# Patient Record
Sex: Female | Born: 1970
Health system: Southern US, Community
[De-identification: ages and names within clinical notes are randomized; demographics above are authoritative.]

## PROBLEM LIST (undated history)

## (undated) DIAGNOSIS — G4730 Sleep apnea, unspecified: Secondary | ICD-10-CM

## (undated) DIAGNOSIS — I059 Rheumatic mitral valve disease, unspecified: Secondary | ICD-10-CM

## (undated) DIAGNOSIS — T7840XA Allergy, unspecified, initial encounter: Secondary | ICD-10-CM

## (undated) DIAGNOSIS — R32 Unspecified urinary incontinence: Secondary | ICD-10-CM

## (undated) DIAGNOSIS — K219 Gastro-esophageal reflux disease without esophagitis: Secondary | ICD-10-CM

## (undated) DIAGNOSIS — R011 Cardiac murmur, unspecified: Secondary | ICD-10-CM

## (undated) DIAGNOSIS — N649 Disorder of breast, unspecified: Secondary | ICD-10-CM

## (undated) DIAGNOSIS — Z8669 Personal history of other diseases of the nervous system and sense organs: Secondary | ICD-10-CM

## (undated) DIAGNOSIS — F329 Major depressive disorder, single episode, unspecified: Secondary | ICD-10-CM

## (undated) DIAGNOSIS — F419 Anxiety disorder, unspecified: Secondary | ICD-10-CM

## (undated) HISTORY — DX: Personal history of other diseases of the nervous system and sense organs: Z86.69

## (undated) HISTORY — DX: Cardiac murmur, unspecified: R01.1

## (undated) HISTORY — DX: Anxiety disorder, unspecified: F41.9

## (undated) HISTORY — DX: Disorder of breast, unspecified: N64.9

## (undated) HISTORY — DX: Rheumatic mitral valve disease, unspecified: I05.9

## (undated) HISTORY — DX: Gastro-esophageal reflux disease without esophagitis: K21.9

## (undated) HISTORY — DX: Sleep apnea, unspecified: G47.30

## (undated) HISTORY — PX: TUBAL LIGATION: SHX77

## (undated) HISTORY — DX: Unspecified urinary incontinence: R32

## (undated) HISTORY — DX: Allergy, unspecified, initial encounter: T78.40XA

## (undated) HISTORY — DX: Major depressive disorder, single episode, unspecified: F32.9

---

## 2004-08-12 ENCOUNTER — Ambulatory Visit: Payer: Self-pay | Admitting: Otolaryngology

## 2005-04-29 ENCOUNTER — Ambulatory Visit: Payer: Self-pay | Admitting: Surgery

## 2008-05-19 ENCOUNTER — Emergency Department: Payer: Self-pay | Admitting: Emergency Medicine

## 2013-10-01 ENCOUNTER — Emergency Department: Payer: Self-pay | Admitting: Emergency Medicine

## 2015-05-30 ENCOUNTER — Other Ambulatory Visit: Payer: Self-pay | Admitting: Cardiology

## 2015-05-30 DIAGNOSIS — R1011 Right upper quadrant pain: Secondary | ICD-10-CM

## 2015-05-31 ENCOUNTER — Telehealth: Payer: Self-pay | Admitting: Surgery

## 2015-05-31 ENCOUNTER — Ambulatory Visit
Admission: RE | Admit: 2015-05-31 | Discharge: 2015-05-31 | Disposition: A | Discharge status: Home / Self Care | Payer: 59 | Source: Ambulatory Visit | Attending: Cardiology | Admitting: Cardiology

## 2015-05-31 DIAGNOSIS — R1011 Right upper quadrant pain: Secondary | ICD-10-CM | POA: Diagnosis not present

## 2015-05-31 NOTE — Telephone Encounter (Signed)
Left message for pt to call and schedule for Cholelithiasis w/o obstruction

## 2015-06-12 ENCOUNTER — Ambulatory Visit: Payer: Self-pay | Admitting: Surgery

## 2015-06-13 ENCOUNTER — Ambulatory Visit: Payer: Self-pay | Admitting: Surgery

## 2015-07-23 ENCOUNTER — Ambulatory Visit: Payer: 59 | Admitting: Gastroenterology

## 2015-08-27 ENCOUNTER — Other Ambulatory Visit: Payer: Self-pay

## 2015-08-27 ENCOUNTER — Ambulatory Visit: Payer: 59 | Admitting: Gastroenterology

## 2015-08-27 DIAGNOSIS — F329 Major depressive disorder, single episode, unspecified: Secondary | ICD-10-CM | POA: Insufficient documentation

## 2015-08-27 DIAGNOSIS — R011 Cardiac murmur, unspecified: Secondary | ICD-10-CM

## 2015-08-27 DIAGNOSIS — G4730 Sleep apnea, unspecified: Secondary | ICD-10-CM | POA: Insufficient documentation

## 2015-08-27 DIAGNOSIS — F32A Depression, unspecified: Secondary | ICD-10-CM

## 2015-08-27 DIAGNOSIS — I341 Nonrheumatic mitral (valve) prolapse: Secondary | ICD-10-CM | POA: Insufficient documentation

## 2015-08-27 DIAGNOSIS — F419 Anxiety disorder, unspecified: Secondary | ICD-10-CM | POA: Insufficient documentation

## 2015-08-27 DIAGNOSIS — I059 Rheumatic mitral valve disease, unspecified: Secondary | ICD-10-CM | POA: Insufficient documentation

## 2015-08-27 HISTORY — DX: Nonrheumatic mitral (valve) prolapse: I34.1

## 2015-08-27 HISTORY — DX: Cardiac murmur, unspecified: R01.1

## 2015-08-27 HISTORY — DX: Sleep apnea, unspecified: G47.30

## 2015-08-27 HISTORY — DX: Depression, unspecified: F32.A

## 2016-07-01 ENCOUNTER — Other Ambulatory Visit: Payer: Self-pay | Admitting: Internal Medicine

## 2016-07-01 DIAGNOSIS — Z1231 Encounter for screening mammogram for malignant neoplasm of breast: Secondary | ICD-10-CM

## 2016-07-21 ENCOUNTER — Ambulatory Visit
Admission: RE | Admit: 2016-07-21 | Discharge: 2016-07-21 | Disposition: A | Discharge status: Home / Self Care | Payer: 59 | Source: Ambulatory Visit | Attending: Internal Medicine | Admitting: Internal Medicine

## 2016-07-21 DIAGNOSIS — Z1231 Encounter for screening mammogram for malignant neoplasm of breast: Secondary | ICD-10-CM | POA: Insufficient documentation

## 2017-02-22 DIAGNOSIS — I341 Nonrheumatic mitral (valve) prolapse: Secondary | ICD-10-CM | POA: Diagnosis not present

## 2017-02-22 DIAGNOSIS — J45909 Unspecified asthma, uncomplicated: Secondary | ICD-10-CM | POA: Diagnosis not present

## 2017-06-29 ENCOUNTER — Other Ambulatory Visit: Payer: Self-pay | Admitting: Internal Medicine

## 2017-06-29 DIAGNOSIS — Z1231 Encounter for screening mammogram for malignant neoplasm of breast: Secondary | ICD-10-CM

## 2017-07-29 ENCOUNTER — Ambulatory Visit
Admission: RE | Admit: 2017-07-29 | Discharge: 2017-07-29 | Disposition: A | Discharge status: Home / Self Care | Payer: 59 | Source: Ambulatory Visit | Attending: Internal Medicine | Admitting: Internal Medicine

## 2017-07-29 DIAGNOSIS — Z1231 Encounter for screening mammogram for malignant neoplasm of breast: Secondary | ICD-10-CM | POA: Diagnosis present

## 2017-12-28 DIAGNOSIS — J329 Chronic sinusitis, unspecified: Secondary | ICD-10-CM | POA: Diagnosis not present

## 2017-12-28 DIAGNOSIS — R109 Unspecified abdominal pain: Secondary | ICD-10-CM | POA: Diagnosis not present

## 2017-12-28 DIAGNOSIS — E65 Localized adiposity: Secondary | ICD-10-CM | POA: Diagnosis not present

## 2017-12-31 ENCOUNTER — Other Ambulatory Visit: Payer: Self-pay | Admitting: Internal Medicine

## 2017-12-31 DIAGNOSIS — R109 Unspecified abdominal pain: Secondary | ICD-10-CM

## 2018-01-05 ENCOUNTER — Ambulatory Visit: Payer: 59

## 2018-03-23 DIAGNOSIS — M47812 Spondylosis without myelopathy or radiculopathy, cervical region: Secondary | ICD-10-CM | POA: Diagnosis not present

## 2018-03-23 DIAGNOSIS — M542 Cervicalgia: Secondary | ICD-10-CM | POA: Diagnosis not present

## 2018-07-26 ENCOUNTER — Other Ambulatory Visit: Payer: Self-pay | Admitting: Internal Medicine

## 2018-07-26 DIAGNOSIS — Z1231 Encounter for screening mammogram for malignant neoplasm of breast: Secondary | ICD-10-CM

## 2018-08-11 ENCOUNTER — Ambulatory Visit
Admission: RE | Admit: 2018-08-11 | Discharge: 2018-08-11 | Disposition: A | Discharge status: Home / Self Care | Payer: 59 | Source: Ambulatory Visit | Attending: Internal Medicine | Admitting: Internal Medicine

## 2018-08-11 DIAGNOSIS — Z1231 Encounter for screening mammogram for malignant neoplasm of breast: Secondary | ICD-10-CM | POA: Diagnosis not present

## 2018-11-24 ENCOUNTER — Ambulatory Visit (INDEPENDENT_AMBULATORY_CARE_PROVIDER_SITE_OTHER): Payer: 59 | Admitting: Internal Medicine

## 2018-11-24 ENCOUNTER — Ambulatory Visit (INDEPENDENT_AMBULATORY_CARE_PROVIDER_SITE_OTHER): Payer: 59

## 2018-11-24 VITALS — BP 126/76 | HR 80 | Temp 98.4°F | Ht 64.0 in | Wt 197.0 lb

## 2018-11-24 DIAGNOSIS — E559 Vitamin D deficiency, unspecified: Secondary | ICD-10-CM

## 2018-11-24 DIAGNOSIS — R195 Other fecal abnormalities: Secondary | ICD-10-CM | POA: Diagnosis not present

## 2018-11-24 DIAGNOSIS — M79604 Pain in right leg: Secondary | ICD-10-CM | POA: Diagnosis not present

## 2018-11-24 DIAGNOSIS — F32A Depression, unspecified: Secondary | ICD-10-CM

## 2018-11-24 DIAGNOSIS — F419 Anxiety disorder, unspecified: Secondary | ICD-10-CM | POA: Diagnosis not present

## 2018-11-24 DIAGNOSIS — Z1329 Encounter for screening for other suspected endocrine disorder: Secondary | ICD-10-CM

## 2018-11-24 DIAGNOSIS — M25561 Pain in right knee: Secondary | ICD-10-CM | POA: Diagnosis not present

## 2018-11-24 DIAGNOSIS — Z0000 Encounter for general adult medical examination without abnormal findings: Secondary | ICD-10-CM

## 2018-11-24 DIAGNOSIS — Z0184 Encounter for antibody response examination: Secondary | ICD-10-CM

## 2018-11-24 DIAGNOSIS — Z1389 Encounter for screening for other disorder: Secondary | ICD-10-CM

## 2018-11-24 DIAGNOSIS — F329 Major depressive disorder, single episode, unspecified: Secondary | ICD-10-CM

## 2018-11-24 DIAGNOSIS — I341 Nonrheumatic mitral (valve) prolapse: Secondary | ICD-10-CM

## 2018-11-24 DIAGNOSIS — Z13220 Encounter for screening for lipoid disorders: Secondary | ICD-10-CM

## 2018-11-24 DIAGNOSIS — M1711 Unilateral primary osteoarthritis, right knee: Secondary | ICD-10-CM | POA: Diagnosis not present

## 2018-11-24 DIAGNOSIS — Z1159 Encounter for screening for other viral diseases: Secondary | ICD-10-CM

## 2018-11-24 MED ORDER — LORAZEPAM 1 MG PO TABS: 1.0000 mg | ORAL_TABLET | Freq: Two times a day (BID) | ORAL | 0 refills | Status: DC | PRN

## 2018-11-24 NOTE — Patient Instructions (Signed)

## 2018-11-24 NOTE — Progress Notes (Signed)
Pre visit review using our clinic review tool, if applicable. No additional management support is needed unless otherwise documented below in the visit note. 

## 2018-11-25 ENCOUNTER — Encounter: Payer: Self-pay | Admitting: Internal Medicine

## 2018-11-25 DIAGNOSIS — M25561 Pain in right knee: Secondary | ICD-10-CM | POA: Insufficient documentation

## 2018-11-25 DIAGNOSIS — R195 Other fecal abnormalities: Secondary | ICD-10-CM | POA: Insufficient documentation

## 2018-11-25 NOTE — Progress Notes (Addendum)
Chief Complaint  Patient presents with  . Follow-up   New patient  1. H/o anxiety depression stable on current medications pristique, wellbutrin, ativan prn  2. Loose stools s/p eating has to go immediately daughter dx'ed with crohns recently  3. Right knee and right lower leg pain s/p trauma 9-10 years ago waking up qhs with pain takes otc pain medications with pain with some relief  4. H/o MVP get echo from Dr. Lavera Guise former PCP   Review of Systems  Constitutional: Negative for weight loss.  HENT: Negative for hearing loss.   Eyes: Negative for blurred vision.  Respiratory: Negative for shortness of breath.   Cardiovascular: Negative for chest pain.  Gastrointestinal: Negative for abdominal pain.       Loose stools after eating    Musculoskeletal: Positive for joint pain.  Skin: Negative for rash.  Neurological: Negative for dizziness.  Psychiatric/Behavioral: Negative for depression. The patient is not nervous/anxious.        Stress at work     Past Medical History:  Diagnosis Date  . Allergy   . Anxiety and depression 08/27/2015  . Ejection murmur 08/27/2015  . GERD (gastroesophageal reflux disease)   . Hx of migraines   . Prolapsing mitral leaflet syndrome 08/27/2015  . Sleep apnea 08/27/2015  . Urine incontinence    History reviewed. No pertinent surgical history. Family History  Problem Relation Age of Onset  . Depression Mother   . Mental illness Mother   . Depression Maternal Grandmother   . Arthritis Maternal Grandmother   . Heart disease Maternal Grandmother   . Hyperlipidemia Maternal Grandmother   . Mental illness Maternal Grandmother   . Depression Paternal Grandmother   . Stroke Paternal Grandmother   . Heart disease Paternal Grandmother   . Alcohol abuse Father   . Cancer Father        lung smoker  . Early death Father   . Heart disease Father   . Depression Daughter   . Crohn's disease Daughter   . Cancer Paternal Aunt        lung   . Cancer  Paternal Uncle        ? type  . Birth defects Maternal Grandfather   . COPD Maternal Grandfather   . Breast cancer Neg Hx    Social History   Socioeconomic History  . Marital status: Married    Spouse name: Not on file  . Number of children: Not on file  . Years of education: Not on file  . Highest education level: Not on file  Occupational History  . Not on file  Social Needs  . Financial resource strain: Not on file  . Food insecurity:    Worry: Not on file    Inability: Not on file  . Transportation needs:    Medical: Not on file    Non-medical: Not on file  Tobacco Use  . Smoking status: Never Smoker  . Smokeless tobacco: Never Used  . Tobacco comment: [place] light smoker   Substance and Sexual Activity  . Alcohol use: No    Alcohol/week: 0.0 standard drinks  . Drug use: No  . Sexual activity: Yes  Lifestyle  . Physical activity:    Days per week: Not on file    Minutes per session: Not on file  . Stress: Not on file  Relationships  . Social connections:    Talks on phone: Not on file    Gets together: Not on file  Attends religious service: Not on file    Active member of club or organization: Not on file    Attends meetings of clubs or organizations: Not on file    Relationship status: Not on file  . Intimate partner violence:    Fear of current or ex partner: Not on file    Emotionally abused: Not on file    Physically abused: Not on file    Forced sexual activity: Not on file  Other Topics Concern  . Not on file  Social History Narrative   Married    2 kids    Works [employer] x 20 years [employer]   Owns guns, wears seat belt, safe in relationship    Current Meds  Medication Sig  . baclofen (LIORESAL) 10 MG tablet Take 10 mg by mouth 3 (three) times daily as needed for muscle spasms.  Marland Kitchen buPROPion (WELLBUTRIN XL) 150 MG 24 hr tablet Take 450 mg by mouth daily.  . Cholecalciferol (VITAMIN D3) 1.25 MG (50000 UT) TABS Take by mouth.  .  desvenlafaxine (PRISTIQ) 100 MG 24 hr tablet Take 100 mg by mouth daily.  Marland Kitchen LORazepam (ATIVAN) 1 MG tablet Take 1 tablet (1 mg total) by mouth 2 (two) times daily as needed for anxiety.  . meloxicam (MOBIC) 7.5 MG tablet Take by mouth daily.   . metoprolol succinate (TOPROL-XL) 50 MG 24 hr tablet Take 50 mg by mouth daily. Take with or immediately following a meal.  . Multiple Vitamins-Minerals (VITAMIN D3 COMPLETE PO) Take 5,000 Units by mouth daily.  Marland Kitchen omeprazole (PRILOSEC) 20 MG capsule Take 20 mg by mouth daily.   Marland Kitchen selenium 200 MCG TABS tablet Take by mouth daily.  . [DISCONTINUED] buPROPion (ZYBAN) 150 MG 12 hr tablet Take 150 mg by mouth 2 (two) times daily.  . [DISCONTINUED] LORazepam (ATIVAN) 1 MG tablet Take 1 mg by mouth every 8 (eight) hours.   No Known Allergies No results found for this or any previous visit (from the past 2160 hour(s)). Objective  Body mass index is 33.81 kg/m. Wt Readings from Last 3 Encounters:  11/24/18 197 lb (89.4 kg)   Temp Readings from Last 3 Encounters:  11/24/18 98.4 F (36.9 C) (Oral)   BP Readings from Last 3 Encounters:  11/24/18 126/76   Pulse Readings from Last 3 Encounters:  11/24/18 80    Physical Exam Vitals signs and nursing note reviewed.  Constitutional:      Appearance: Normal appearance. She is well-developed and well-groomed.  HENT:     Head: Normocephalic and atraumatic.     Nose: Nose normal.     Mouth/Throat:     Mouth: Mucous membranes are moist.     Pharynx: Oropharynx is clear.  Eyes:     Conjunctiva/sclera: Conjunctivae normal.     Pupils: Pupils are equal, round, and reactive to light.  Cardiovascular:     Rate and Rhythm: Normal rate and regular rhythm.     Heart sounds: Murmur present.  Pulmonary:     Effort: Pulmonary effort is normal.     Breath sounds: Normal breath sounds.  Musculoskeletal:       Legs:  Skin:    General: Skin is warm and dry.  Neurological:     General: No focal deficit  present.     Mental Status: She is alert and oriented to person, place, and time. Mental status is at baseline.     Gait: Gait normal.  Psychiatric:  Attention and Perception: Attention and perception normal.        Mood and Affect: Mood and affect normal.        Speech: Speech normal.        Behavior: Behavior normal. Behavior is cooperative.        Thought Content: Thought content normal.        Cognition and Memory: Cognition and memory normal.        Judgment: Judgment normal.     Assessment   1. H/o anxiety depression stable  2. Loose stools s/p eating  3. Right knee and right lower leg pain s/p trauma 9-10 years ago waking up qhs with pain  4. H/o MVP  5. HM Plan   1. Refilled Ativan today prn  2. Referred to GI  3.  Xray right knee and lower leg  4. Get records Dr. Lavera Guise echo no active sx's  5.  Flu shot had 06/11/18  Tdap 10/2014 check [facility] Check fasting labs [facility]  Pap at f/u  mammo 08/11/18 negative  Colonoscopy age 74 yo   Former PCP Dr. Lavera Guise  Eye Dr. Matilde Sprang   [facility] 12/21/2018 URI given Augmentin bid x 10 days rec otc meds  [facility] 04/24/19 BP 135/88 for rectal pain with large blood inurine c/w UTI given macrobid   Provider: Dr. Olivia Mackie [facility]

## 2018-11-27 ENCOUNTER — Other Ambulatory Visit: Payer: Self-pay | Admitting: Internal Medicine

## 2018-11-27 DIAGNOSIS — G8929 Other chronic pain: Secondary | ICD-10-CM

## 2018-11-27 DIAGNOSIS — M79604 Pain in right leg: Secondary | ICD-10-CM

## 2018-11-27 DIAGNOSIS — M25561 Pain in right knee: Principal | ICD-10-CM

## 2018-11-28 NOTE — Progress Notes (Signed)
Tdap not in [facility]  TMS 

## 2018-11-28 NOTE — Progress Notes (Signed)
Patient not in [facility]. 

## 2018-11-29 ENCOUNTER — Other Ambulatory Visit (INDEPENDENT_AMBULATORY_CARE_PROVIDER_SITE_OTHER): Payer: 59

## 2018-11-29 DIAGNOSIS — Z1389 Encounter for screening for other disorder: Secondary | ICD-10-CM

## 2018-11-29 DIAGNOSIS — Z1159 Encounter for screening for other viral diseases: Secondary | ICD-10-CM | POA: Diagnosis not present

## 2018-11-29 DIAGNOSIS — Z0000 Encounter for general adult medical examination without abnormal findings: Secondary | ICD-10-CM | POA: Diagnosis not present

## 2018-11-29 DIAGNOSIS — Z0184 Encounter for antibody response examination: Secondary | ICD-10-CM

## 2018-11-29 DIAGNOSIS — Z1329 Encounter for screening for other suspected endocrine disorder: Secondary | ICD-10-CM

## 2018-11-29 DIAGNOSIS — E559 Vitamin D deficiency, unspecified: Secondary | ICD-10-CM

## 2018-11-29 DIAGNOSIS — Z13220 Encounter for screening for lipoid disorders: Secondary | ICD-10-CM

## 2018-11-29 NOTE — Addendum Note (Signed)
Addended by: Arby Barrette on: 11/29/2018 08:51 AM   Modules accepted: Orders

## 2018-11-30 ENCOUNTER — Other Ambulatory Visit: Payer: Self-pay | Admitting: Internal Medicine

## 2018-11-30 DIAGNOSIS — R739 Hyperglycemia, unspecified: Secondary | ICD-10-CM

## 2018-11-30 LAB — URINALYSIS, ROUTINE W REFLEX MICROSCOPIC
Bilirubin, UA: NEGATIVE
Glucose, UA: NEGATIVE
KETONES UA: NEGATIVE
Leukocytes, UA: NEGATIVE
NITRITE UA: NEGATIVE
PROTEIN UA: NEGATIVE
RBC, UA: NEGATIVE
Specific Gravity, UA: 1.022 (ref 1.005–1.030)
UUROB: 0.2 mg/dL (ref 0.2–1.0)
pH, UA: 5.5 (ref 5.0–7.5)

## 2018-12-02 ENCOUNTER — Encounter: Payer: Self-pay | Admitting: *Deleted

## 2018-12-02 ENCOUNTER — Telehealth: Payer: Self-pay | Admitting: Internal Medicine

## 2018-12-02 LAB — COMPREHENSIVE METABOLIC PANEL
AG Ratio: 1.8 (calc) (ref 1.0–2.5)
ALT: 25 U/L (ref 6–29)
AST: 16 U/L (ref 10–35)
Albumin: 4.1 g/dL (ref 3.6–5.1)
Alkaline phosphatase (APISO): 87 U/L (ref 31–125)
BILIRUBIN TOTAL: 0.5 mg/dL (ref 0.2–1.2)
BUN: 14 mg/dL (ref 7–25)
CHLORIDE: 105 mmol/L (ref 98–110)
CO2: 23 mmol/L (ref 20–32)
Calcium: 9 mg/dL (ref 8.6–10.2)
Creat: 1.1 mg/dL (ref 0.50–1.10)
Globulin: 2.3 g/dL (calc) (ref 1.9–3.7)
Glucose, Bld: 107 mg/dL — ABNORMAL HIGH (ref 65–99)
POTASSIUM: 4.2 mmol/L (ref 3.5–5.3)
Sodium: 140 mmol/L (ref 135–146)
TOTAL PROTEIN: 6.4 g/dL (ref 6.1–8.1)

## 2018-12-02 LAB — TEST AUTHORIZATION

## 2018-12-02 LAB — CBC WITH DIFFERENTIAL/PLATELET
Absolute Monocytes: 540 cells/uL (ref 200–950)
BASOS PCT: 1.1 %
Basophils Absolute: 78 cells/uL (ref 0–200)
EOS ABS: 227 {cells}/uL (ref 15–500)
Eosinophils Relative: 3.2 %
HEMATOCRIT: 37.9 % (ref 35.0–45.0)
HEMOGLOBIN: 13.3 g/dL (ref 11.7–15.5)
LYMPHS ABS: 1385 {cells}/uL (ref 850–3900)
MCH: 32.9 pg (ref 27.0–33.0)
MCHC: 35.1 g/dL (ref 32.0–36.0)
MCV: 93.8 fL (ref 80.0–100.0)
MPV: 12.4 fL (ref 7.5–12.5)
Monocytes Relative: 7.6 %
Neutro Abs: 4871 cells/uL (ref 1500–7800)
Neutrophils Relative %: 68.6 %
Platelets: 293 10*3/uL (ref 140–400)
RBC: 4.04 10*6/uL (ref 3.80–5.10)
RDW: 11.8 % (ref 11.0–15.0)
TOTAL LYMPHOCYTE: 19.5 %
WBC: 7.1 10*3/uL (ref 3.8–10.8)

## 2018-12-02 LAB — MEASLES/MUMPS/RUBELLA IMMUNITY: Rubella: 10.1 index

## 2018-12-02 LAB — LIPID PANEL
CHOL/HDL RATIO: 6 (calc) — AB (ref <5.0)
CHOLESTEROL: 228 mg/dL — AB (ref <200)
HDL: 38 mg/dL — ABNORMAL LOW (ref >=50)
LDL Cholesterol (Calc): 165 mg/dL (calc) — ABNORMAL HIGH
Non-HDL Cholesterol (Calc): 190 mg/dL (calc) — ABNORMAL HIGH (ref <130)
TRIGLYCERIDES: 120 mg/dL (ref <150)

## 2018-12-02 LAB — T4, FREE: Free T4: 0.9 ng/dL (ref 0.8–1.8)

## 2018-12-02 LAB — TSH: TSH: 3.56 mIU/L

## 2018-12-02 LAB — HEMOGLOBIN A1C
EAG (MMOL/L): 5 (calc)
Hgb A1c MFr Bld: 4.8 % of total Hgb (ref <5.7)
Mean Plasma Glucose: 91 (calc)

## 2018-12-02 LAB — HEPATITIS B SURFACE ANTIBODY, QUANTITATIVE: Hepatitis B-Post: <5 m[IU]/mL — ABNORMAL LOW (ref >=10)

## 2018-12-02 LAB — VITAMIN D 25 HYDROXY (VIT D DEFICIENCY, FRACTURES): Vit D, 25-Hydroxy: 32 ng/mL (ref 30–100)

## 2018-12-02 NOTE — Telephone Encounter (Signed)
Pt given results per notes of Dr. Terese Door on 12/02/18, patient verbalized understanding. She says she is not taking a weekly dose of Vitamin D, only the daily OTC dose of 5000 IU. She says she has an appointment in April and will start the hep B vaccines at that time. Unable to document in result note due to result note not being routed to [facility]

## 2018-12-12 DIAGNOSIS — R195 Other fecal abnormalities: Secondary | ICD-10-CM | POA: Diagnosis not present

## 2018-12-12 DIAGNOSIS — R194 Change in bowel habit: Secondary | ICD-10-CM | POA: Diagnosis not present

## 2018-12-19 DIAGNOSIS — R194 Change in bowel habit: Secondary | ICD-10-CM | POA: Diagnosis not present

## 2018-12-19 DIAGNOSIS — R195 Other fecal abnormalities: Secondary | ICD-10-CM | POA: Diagnosis not present

## 2019-01-26 ENCOUNTER — Other Ambulatory Visit: Payer: Self-pay | Admitting: Internal Medicine

## 2019-01-26 DIAGNOSIS — F419 Anxiety disorder, unspecified: Secondary | ICD-10-CM

## 2019-01-26 MED ORDER — LORAZEPAM 1 MG PO TABS: 1.0000 mg | ORAL_TABLET | Freq: Two times a day (BID) | ORAL | 2 refills | Status: DC | PRN

## 2019-02-01 ENCOUNTER — Ambulatory Visit: Payer: 59 | Admitting: Internal Medicine

## 2019-04-24 DIAGNOSIS — N3001 Acute cystitis with hematuria: Secondary | ICD-10-CM | POA: Diagnosis not present

## 2019-05-02 ENCOUNTER — Ambulatory Visit (INDEPENDENT_AMBULATORY_CARE_PROVIDER_SITE_OTHER): Payer: 59 | Admitting: Internal Medicine

## 2019-05-02 ENCOUNTER — Other Ambulatory Visit: Payer: Self-pay

## 2019-05-02 DIAGNOSIS — Z0000 Encounter for general adult medical examination without abnormal findings: Secondary | ICD-10-CM | POA: Insufficient documentation

## 2019-05-02 DIAGNOSIS — N926 Irregular menstruation, unspecified: Secondary | ICD-10-CM | POA: Diagnosis not present

## 2019-05-02 DIAGNOSIS — Z1231 Encounter for screening mammogram for malignant neoplasm of breast: Secondary | ICD-10-CM

## 2019-05-02 DIAGNOSIS — R030 Elevated blood-pressure reading, without diagnosis of hypertension: Secondary | ICD-10-CM | POA: Diagnosis not present

## 2019-05-02 DIAGNOSIS — N3001 Acute cystitis with hematuria: Secondary | ICD-10-CM | POA: Diagnosis not present

## 2019-05-02 DIAGNOSIS — R69 Illness, unspecified: Secondary | ICD-10-CM | POA: Diagnosis not present

## 2019-05-02 DIAGNOSIS — N3000 Acute cystitis without hematuria: Secondary | ICD-10-CM | POA: Diagnosis not present

## 2019-05-02 DIAGNOSIS — Z124 Encounter for screening for malignant neoplasm of cervix: Secondary | ICD-10-CM | POA: Diagnosis not present

## 2019-05-02 DIAGNOSIS — F419 Anxiety disorder, unspecified: Secondary | ICD-10-CM

## 2019-05-02 DIAGNOSIS — E785 Hyperlipidemia, unspecified: Secondary | ICD-10-CM | POA: Diagnosis not present

## 2019-05-02 MED ORDER — LORAZEPAM 1 MG PO TABS: 1.0000 mg | ORAL_TABLET | Freq: Two times a day (BID) | ORAL | 2 refills | Status: DC | PRN

## 2019-05-02 NOTE — Progress Notes (Signed)
Telephone Note  I connected with Carol Stevens   on 05/02/19 at  2:00 PM EDT by  telephone and verified that I am speaking with the correct person using two identifiers.  Location patient: home Location provider:work Persons participating in the virtual visit: patient, provider  I discussed the limitations of evaluation and management by telemedicine and the availability of in person appointments. The patient expressed understanding and agreed to proceed.   HPI: 1. Annual  2. Abnormal menses with cycles every 2 weeks since 02/2019. And she is due for pap will refer to OB/GYN Dr. Darron Do[facility]  3. Urgent care/[facility] clinic appts 12/2018 URI and 04/24/2019 UTI and rectal pain and blood in urine and unable to pee given macrobid and doing better able to urinate BP was elevated this visit 135/88  4. Depression and anxiety worse with COVID stressor at work takes prn Ativan and wellbutrin 450 mg qd and pristique   ROS: See pertinent positives and negatives per HPI. General: weight stable  HEENT: no sore throat  CV: no chest pain  Lungs: no sob  GI: ab bloating and pain pending colonoscopy 05/30/19  GU: abnormal menses Skin: no issues  Mood: variable and anxiety increased + Neuro: no h/a  MSK: no pain   HEENT Past Medical History:  Diagnosis Date  . Allergy   . Anxiety and depression 08/27/2015  . Ejection murmur 08/27/2015  . GERD (gastroesophageal reflux disease)   . Hx of migraines   . Prolapsing mitral leaflet syndrome 08/27/2015  . Sleep apnea 08/27/2015  . Urine incontinence     No past surgical history on file.  Family History  Problem Relation Age of Onset  . Depression Mother   . Mental illness Mother   . Depression Maternal Grandmother   . Arthritis Maternal Grandmother   . Heart disease Maternal Grandmother   . Hyperlipidemia Maternal Grandmother   . Mental illness Maternal Grandmother   . Depression Paternal Grandmother   . Stroke Paternal  Grandmother   . Heart disease Paternal Grandmother   . Alcohol abuse Father   . Cancer Father        lung smoker  . Early death Father   . Heart disease Father   . Depression Daughter   . Crohn's disease Daughter   . Cancer Paternal Aunt        lung   . Cancer Paternal Uncle        ? type  . Birth defects Maternal Grandfather   . COPD Maternal Grandfather   . Breast cancer Neg Hx     SOCIAL HX: married with kids    Current Outpatient Medications:  .  baclofen (LIORESAL) 10 MG tablet, Take 10 mg by mouth 3 (three) times daily as needed for muscle spasms., Disp: , Rfl:  .  buPROPion (WELLBUTRIN XL) 150 MG 24 hr tablet, Take 450 mg by mouth daily., Disp: , Rfl:  .  Cholecalciferol (VITAMIN D3) 1.25 MG (50000 UT) TABS, Take by mouth., Disp: , Rfl:  .  desvenlafaxine (PRISTIQ) 100 MG 24 hr tablet, Take 100 mg by mouth daily., Disp: , Rfl:  .  LORazepam (ATIVAN) 1 MG tablet, Take 1 tablet (1 mg total) by mouth 2 (two) times daily as needed for anxiety., Disp: 60 tablet, Rfl: 2 .  meloxicam (MOBIC) 7.5 MG tablet, Take by mouth daily. , Disp: , Rfl:  .  metoprolol succinate (TOPROL-XL) 50 MG 24 hr tablet, Take 50 mg by mouth daily.  Take with or immediately following a meal., Disp: , Rfl:  .  Multiple Vitamins-Minerals (VITAMIN D3 COMPLETE PO), Take 5,000 Units by mouth daily., Disp: , Rfl:  .  omeprazole (PRILOSEC) 20 MG capsule, Take 20 mg by mouth daily. , Disp: , Rfl:  .  selenium 200 MCG TABS tablet, Take by mouth daily., Disp: , Rfl:   EXAM:  VITALS per patient if applicable:  GENERAL: alert, oriented, appears well and in no acute distress  PSYCH/NEURO: pleasant and cooperative, no obvious depression or anxiety, speech and thought processing grossly intact  ASSESSMENT AND PLAN:  Discussed the following assessment and plan:  Annual physical exam - Plan:  flu shot had 06/11/18  Tdap 10/2014 check [facility] not in [place] per pt had in 2016 due in 2026  Check lipid fasting fsh and ua  and culture  Consider MMR vaccine and hep B  Pap at referred ob/gyn Dr. Darron Doom for abnormal cycles and check [facility] and TVUS/pelvic US  mammo 08/11/18 negative referred upcoming 08/12/19 Colonoscopy age 71 yo appt [facility] 05/30/2019 due to abdominal issues  skin-no issues   Former PCP Dr. Lavera Guise  Eye Dr. Matilde Sprang   UTI improved Repeat UA and culture    Elevated BP  -monitor pt to purchase BP cuff to monitor  On toprol xl 50 mg daily  Consider add another agent in future if continues to elevated I.e hctz, norvasc 2.5  [facility] 12/21/2018 URI given Augmentin bid x 10 days rec otc meds  [facility] 04/24/19 BP 135/88 for rectal pain with large blood inurine c/w UTI given macrobid  -pt was unable to pee but able to pee now as of 05/02/19    Hyperlipidemia, unspecified hyperlipidemia type - Plan: Lipid panel  Anxiety - Plan: LORazepam (ATIVAN) 1 MG tablet bid prn   I discussed the assessment and treatment plan with the patient. The patient was provided an opportunity to ask questions and all were answered. The patient agreed with the plan and demonstrated an understanding of the instructions.   The patient was advised to call back or seek an in-person evaluation if the symptoms worsen or if the condition fails to improve as anticipated.  Time spent 20 minutes  Delorise Jackson, MD

## 2019-05-02 NOTE — Patient Instructions (Addendum)
Low-FODMAP Eating Plan  FODMAPs (fermentable oligosaccharides, disaccharides, monosaccharides, and polyols) are sugars that are hard for some people to digest. A low-FODMAP eating plan may help some people who have bowel (intestinal) diseases to manage their symptoms. This meal plan can be complicated to follow. Work with a diet and nutrition specialist (dietitian) to make a low-FODMAP eating plan that is right for you. A dietitian can make sure that you get enough nutrition from this diet. What are tips for following this plan? Reading food labels  Check labels for hidden FODMAPs such as: ? High-fructose syrup. ? Honey. ? Agave. ? Natural fruit flavors. ? Onion or garlic powder.  Choose low-FODMAP foods that contain 3-4 grams of fiber per serving.  Check food labels for serving sizes. Eat only one serving at a time to make sure FODMAP levels stay low. Meal planning  Follow a low-FODMAP eating plan for up to 6 weeks, or as told by your health care provider or dietitian.  To follow the eating plan: 1. Eliminate high-FODMAP foods from your diet completely. 2. Gradually reintroduce high-FODMAP foods into your diet one at a time. Most people should wait a few days after introducing one high-FODMAP food before they introduce the next high-FODMAP food. Your dietitian can recommend how quickly you may reintroduce foods. 3. Keep a daily record of what you eat and drink, and make note of any symptoms that you have after eating. 4. Review your daily record with a dietitian regularly. Your dietitian can help you identify which foods you can eat and which foods you should avoid. General tips  Drink enough fluid each day to keep your urine pale yellow.  Avoid processed foods. These often have added sugar and may be high in FODMAPs.  Avoid most dairy products, whole grains, and sweeteners.  Work with a dietitian to make sure you get enough fiber in your diet. Recommended foods Grains   Gluten-free grains, such as rice, oats, buckwheat, quinoa, corn, polenta, and millet. Gluten-free pasta, bread, or cereal. Rice noodles. Corn tortillas. Vegetables  Eggplant, zucchini, cucumber, peppers, green beans, Brussels sprouts, bean sprouts, lettuce, arugula, kale, Swiss chard, spinach, collard greens, bok choy, summer squash, potato, and tomato. Limited amounts of corn, carrot, and sweet potato. Green parts of scallions. Fruits  Bananas, oranges, lemons, limes, blueberries, raspberries, strawberries, grapes, cantaloupe, honeydew melon, kiwi, papaya, passion fruit, and pineapple. Limited amounts of dried cranberries, banana chips, and shredded coconut. Dairy  Lactose-free milk, yogurt, and kefir. Lactose-free cottage cheese and ice cream. Non-dairy milks, such as almond, coconut, hemp, and rice milk. Yogurts made of non-dairy milks. Limited amounts of goat cheese, brie, mozzarella, parmesan, swiss, and other hard cheeses. Meats and other protein foods  Unseasoned beef, pork, poultry, or fish. Eggs. Bacon. Tofu (firm) and tempeh. Limited amounts of nuts and seeds, such as almonds, walnuts, brazil nuts, pecans, peanuts, pumpkin seeds, chia seeds, and sunflower seeds. Fats and oils  Butter-free spreads. Vegetable oils, such as olive, canola, and sunflower oil. Seasoning and other foods  Artificial sweeteners with names that do not end in "ol" such as aspartame, saccharine, and stevia. Maple syrup, white table sugar, raw sugar, brown sugar, and molasses. Fresh basil, coriander, parsley, rosemary, and thyme. Beverages  Water and mineral water. Sugar-sweetened soft drinks. Small amounts of orange juice or cranberry juice. Black and green tea. Most dry wines. Coffee. This may not be a complete list of low-FODMAP foods. Talk with your dietitian for more information. Foods to avoid Grains  Wheat,   including kamut, durum, and semolina. Barley and bulgur. Couscous. Wheat-based cereals. Wheat  noodles, bread, crackers, and pastries. Vegetables  Chicory root, artichoke, asparagus, cabbage, snow peas, sugar snap peas, mushrooms, and cauliflower. Onions, garlic, leeks, and the white part of scallions. Fruits  Fresh, dried, and juiced forms of apple, pear, watermelon, peach, plum, cherries, apricots, blackberries, boysenberries, figs, nectarines, and mango. Avocado. Dairy  Milk, yogurt, ice cream, and soft cheese. Cream and sour cream. Milk-based sauces. Custard. Meats and other protein foods  Fried or fatty meat. Sausage. Cashews and pistachios. Soybeans, baked beans, black beans, chickpeas, kidney beans, fava beans, navy beans, lentils, and split peas. Seasoning and other foods  Any sugar-free gum or candy. Foods that contain artificial sweeteners such as sorbitol, mannitol, isomalt, or xylitol. Foods that contain honey, high-fructose corn syrup, or agave. Bouillon, vegetable stock, beef stock, and chicken stock. Garlic and onion powder. Condiments made with onion, such as hummus, chutney, pickles, relish, salad dressing, and salsa. Tomato paste. Beverages  Chicory-based drinks. Coffee substitutes. Chamomile tea. Fennel tea. Sweet or fortified wines such as port or sherry. Diet soft drinks made with isomalt, mannitol, maltitol, sorbitol, or xylitol. Apple, pear, and mango juice. Juices with high-fructose corn syrup. This may not be a complete list of high-FODMAP foods. Talk with your dietitian to discuss what dietary choices are best for you.  Summary  A low-FODMAP eating plan is a short-term diet that eliminates FODMAPs from your diet to help ease symptoms of certain bowel diseases.  The eating plan usually lasts up to 6 weeks. After that, high-FODMAP foods are restarted gradually, one at a time, so you can find out which may be causing symptoms.  A low-FODMAP eating plan can be complicated. It is best to work with a dietitian who has experience with this type of plan. This  information is not intended to replace advice given to you by your health care provider. Make sure you discuss any questions you have with your health care provider. Document Released: 05/25/2017 Document Revised: 09/10/2017 Document Reviewed: 05/25/2017 Elsevier Patient Education  [place].  Low-Sodium Eating Plan Sodium, which is an element that makes up salt, helps you maintain a healthy balance of fluids in your body. Too much sodium can increase your blood pressure and cause fluid and waste to be held in your body. Your health care provider or dietitian may recommend following this plan if you have high blood pressure (hypertension), kidney disease, liver disease, or heart failure. Eating less sodium can help lower your blood pressure, reduce swelling, and protect your heart, liver, and kidneys. What are tips for following this plan? General guidelines  Most people on this plan should limit their sodium intake to 1,500-2,000 mg (milligrams) of sodium each day. Reading food labels   The Nutrition Facts label lists the amount of sodium in one serving of the food. If you eat more than one serving, you must multiply the listed amount of sodium by the number of servings.  Choose foods with less than 140 mg of sodium per serving.  Avoid foods with 300 mg of sodium or more per serving. Shopping  Look for lower-sodium products, often labeled as "low-sodium" or "no salt added."  Always check the sodium content even if foods are labeled as "unsalted" or "no salt added".  Buy fresh foods. ? Avoid canned foods and premade or frozen meals. ? Avoid canned, cured, or processed meats  Buy breads that have less than 80 mg of sodium per slice.  Cooking  Eat more home-cooked food and less restaurant, buffet, and fast food.  Avoid adding salt when cooking. Use salt-free seasonings or herbs instead of table salt or sea salt. Check with your health care provider or pharmacist before using  salt substitutes.  Cook with plant-based oils, such as canola, sunflower, or olive oil. Meal planning  When eating at a restaurant, ask that your food be prepared with less salt or no salt, if possible.  Avoid foods that contain MSG (monosodium glutamate). MSG is sometimes added to [place] food, bouillon, and some canned foods. What foods are recommended? The items listed may not be a complete list. Talk with your dietitian about what dietary choices are best for you. Grains Low-sodium cereals, including oats, puffed wheat and rice, and shredded wheat. Low-sodium crackers. Unsalted rice. Unsalted pasta. Low-sodium bread. Whole-grain breads and whole-grain pasta. Vegetables Fresh or frozen vegetables. "No salt added" canned vegetables. "No salt added" tomato sauce and paste. Low-sodium or reduced-sodium tomato and vegetable juice. Fruits Fresh, frozen, or canned fruit. Fruit juice. Meats and other protein foods Fresh or frozen (no salt added) meat, poultry, seafood, and fish. Low-sodium canned tuna and salmon. Unsalted nuts. Dried peas, beans, and lentils without added salt. Unsalted canned beans. Eggs. Unsalted nut butters. Dairy Milk. Soy milk. Cheese that is naturally low in sodium, such as ricotta cheese, fresh mozzarella, or Swiss cheese Low-sodium or reduced-sodium cheese. Cream cheese. Yogurt. Fats and oils Unsalted butter. Unsalted margarine with no trans fat. Vegetable oils such as canola or olive oils. Seasonings and other foods Fresh and dried herbs and spices. Salt-free seasonings. Low-sodium mustard and ketchup. Sodium-free salad dressing. Sodium-free light mayonnaise. Fresh or refrigerated horseradish. Lemon juice. Vinegar. Homemade, reduced-sodium, or low-sodium soups. Unsalted popcorn and pretzels. Low-salt or salt-free chips. What foods are not recommended? The items listed may not be a complete list. Talk with your dietitian about what dietary choices are best for you.  Grains Instant hot cereals. Bread stuffing, pancake, and biscuit mixes. Croutons. Seasoned rice or pasta mixes. Noodle soup cups. Boxed or frozen macaroni and cheese. Regular salted crackers. Self-rising flour. Vegetables Sauerkraut, pickled vegetables, and relishes. Olives. [place] fries. Onion rings. Regular canned vegetables (not low-sodium or reduced-sodium). Regular canned tomato sauce and paste (not low-sodium or reduced-sodium). Regular tomato and vegetable juice (not low-sodium or reduced-sodium). Frozen vegetables in sauces. Meats and other protein foods Meat or fish that is salted, canned, smoked, spiced, or pickled. Bacon, ham, sausage, hotdogs, corned beef, chipped beef, packaged lunch meats, salt pork, jerky, pickled herring, anchovies, regular canned tuna, sardines, salted nuts. Dairy Processed cheese and cheese spreads. Cheese curds. Blue cheese. Feta cheese. String cheese. Regular cottage cheese. Buttermilk. Canned milk. Fats and oils Salted butter. Regular margarine. Ghee. Bacon fat. Seasonings and other foods Onion salt, garlic salt, seasoned salt, table salt, and sea salt. Canned and packaged gravies. Worcestershire sauce. Tartar sauce. Barbecue sauce. Teriyaki sauce. Soy sauce, including reduced-sodium. Steak sauce. Fish sauce. Oyster sauce. Cocktail sauce. Horseradish that you find on the shelf. Regular ketchup and mustard. Meat flavorings and tenderizers. Bouillon cubes. Hot sauce and Tabasco sauce. Premade or packaged marinades. Premade or packaged taco seasonings. Relishes. Regular salad dressings. Salsa. Potato and tortilla chips. Corn chips and puffs. Salted popcorn and pretzels. Canned or dried soups. Pizza. Frozen entrees and pot pies. Summary  Eating less sodium can help lower your blood pressure, reduce swelling, and protect your heart, liver, and kidneys.  Most people on this plan should limit their sodium  intake to 1,500-2,000 mg (milligrams) of sodium each day.   Canned, boxed, and frozen foods are high in sodium. Restaurant foods, fast foods, and pizza are also very high in sodium. You also get sodium by adding salt to food.  Try to cook at home, eat more fresh fruits and vegetables, and eat less fast food, canned, processed, or prepared foods. This information is not intended to replace advice given to you by your health care provider. Make sure you discuss any questions you have with your health care provider. Document Released: 03/20/2002 Document Revised: 09/10/2017 Document Reviewed: 09/21/2016 Elsevier Patient Education  2020 [employer].

## 2019-05-05 ENCOUNTER — Telehealth: Payer: Self-pay | Admitting: Internal Medicine

## 2019-05-05 ENCOUNTER — Encounter: Payer: Self-pay | Admitting: Internal Medicine

## 2019-05-05 DIAGNOSIS — N3000 Acute cystitis without hematuria: Secondary | ICD-10-CM | POA: Diagnosis not present

## 2019-05-05 LAB — URINALYSIS, ROUTINE W REFLEX MICROSCOPIC
Bilirubin, UA: NEGATIVE
Glucose, UA: NEGATIVE
Ketones, UA: NEGATIVE
Leukocytes,UA: NEGATIVE
Nitrite, UA: NEGATIVE
Protein,UA: NEGATIVE
RBC, UA: NEGATIVE
Specific Gravity, UA: 1.02 (ref 1.005–1.030)
Urobilinogen, Ur: 0.2 mg/dL (ref 0.2–1.0)
pH, UA: 7 (ref 5.0–7.5)

## 2019-05-05 NOTE — Telephone Encounter (Signed)
I was supposed to have my cholesterol and hormone levels checked when I went to [facility] but they didn't have an order to draw blood.  [place] and lipid labs ordered [facility] what happened to labs?  Call [facility] to check on [facility] and lipid    [place]

## 2019-05-05 NOTE — Telephone Encounter (Signed)
It was ordered correctly & we could have faxed them the orders while she was still there.

## 2019-05-05 NOTE — Telephone Encounter (Signed)
[place] and lipid were ordered I will get the nurse to call [facility] and see what is going on  [place]

## 2019-05-05 NOTE — Telephone Encounter (Signed)
Patient is coming to pick up orders. She did not have blood draw due to no orders.

## 2019-05-09 ENCOUNTER — Telehealth: Payer: Self-pay | Admitting: Internal Medicine

## 2019-05-09 ENCOUNTER — Other Ambulatory Visit: Payer: Self-pay | Admitting: Internal Medicine

## 2019-05-09 DIAGNOSIS — I341 Nonrheumatic mitral (valve) prolapse: Secondary | ICD-10-CM

## 2019-05-09 DIAGNOSIS — K219 Gastro-esophageal reflux disease without esophagitis: Secondary | ICD-10-CM

## 2019-05-09 DIAGNOSIS — F329 Major depressive disorder, single episode, unspecified: Secondary | ICD-10-CM

## 2019-05-09 DIAGNOSIS — I059 Rheumatic mitral valve disease, unspecified: Secondary | ICD-10-CM

## 2019-05-09 DIAGNOSIS — F32A Depression, unspecified: Secondary | ICD-10-CM

## 2019-05-09 MED ORDER — OMEPRAZOLE 20 MG PO CPDR: 20.0000 mg | DELAYED_RELEASE_CAPSULE | Freq: Every day | ORAL | 3 refills | Status: DC

## 2019-05-09 MED ORDER — DESVENLAFAXINE SUCCINATE ER 100 MG PO TB24: 100.0000 mg | ORAL_TABLET | Freq: Every day | ORAL | 3 refills | Status: DC

## 2019-05-09 MED ORDER — BUPROPION HCL ER (XL) 150 MG PO TB24: 450.0000 mg | ORAL_TABLET | Freq: Every day | ORAL | 3 refills | Status: DC

## 2019-05-09 MED ORDER — METOPROLOL SUCCINATE ER 50 MG PO TB24: 50.0000 mg | ORAL_TABLET | Freq: Every day | ORAL | 3 refills | Status: DC

## 2019-05-09 NOTE — Telephone Encounter (Signed)
Call pt who was prescribing Pristiq for her before?   Thanks [employer]

## 2019-05-09 NOTE — Telephone Encounter (Signed)
Dr. Cletis [place] was refilling it in the past. She also stated she will go to lab tomorrow. She was broguth in at 8 this morning.

## 2019-05-10 ENCOUNTER — Encounter: Payer: Self-pay | Admitting: Internal Medicine

## 2019-05-10 ENCOUNTER — Other Ambulatory Visit: Payer: Self-pay | Admitting: Internal Medicine

## 2019-05-10 DIAGNOSIS — N926 Irregular menstruation, unspecified: Secondary | ICD-10-CM | POA: Diagnosis not present

## 2019-05-11 LAB — LIPID PANEL
Chol/HDL Ratio: 4.9 ratio — ABNORMAL HIGH (ref 0.0–4.4)
Cholesterol, Total: 211 mg/dL — ABNORMAL HIGH (ref 100–199)
HDL: 43 mg/dL (ref >39)
LDL Calculated: 137 mg/dL — ABNORMAL HIGH (ref 0–99)
Triglycerides: 155 mg/dL — ABNORMAL HIGH (ref 0–149)
VLDL Cholesterol Cal: 31 mg/dL (ref 5–40)

## 2019-05-11 LAB — FOLLICLE STIMULATING HORMONE: FSH: 4.6 m[IU]/mL

## 2019-05-26 ENCOUNTER — Other Ambulatory Visit
Admission: RE | Admit: 2019-05-26 | Discharge: 2019-05-26 | Disposition: A | Discharge status: Home / Self Care | Payer: 59 | Source: Ambulatory Visit | Attending: Gastroenterology | Admitting: Gastroenterology

## 2019-05-26 DIAGNOSIS — Z01812 Encounter for preprocedural laboratory examination: Secondary | ICD-10-CM | POA: Insufficient documentation

## 2019-05-26 DIAGNOSIS — Z20828 Contact with and (suspected) exposure to other viral communicable diseases: Secondary | ICD-10-CM | POA: Insufficient documentation

## 2019-05-26 DIAGNOSIS — R194 Change in bowel habit: Secondary | ICD-10-CM | POA: Insufficient documentation

## 2019-05-26 DIAGNOSIS — R197 Diarrhea, unspecified: Secondary | ICD-10-CM | POA: Insufficient documentation

## 2019-05-26 DIAGNOSIS — K635 Polyp of colon: Secondary | ICD-10-CM | POA: Diagnosis not present

## 2019-05-26 LAB — SARS CORONAVIRUS 2 (TAT 6-24 HRS): SARS Coronavirus 2: NEGATIVE

## 2019-05-29 ENCOUNTER — Encounter: Payer: 59 | Admitting: Family Medicine

## 2019-05-29 ENCOUNTER — Encounter: Payer: Self-pay | Admitting: *Deleted

## 2019-05-30 ENCOUNTER — Encounter: Payer: Self-pay | Admitting: Anesthesiology

## 2019-05-30 ENCOUNTER — Ambulatory Visit: Payer: 59 | Admitting: Anesthesiology

## 2019-05-30 ENCOUNTER — Encounter
Admission: RE | Disposition: A | Discharge status: Home / Self Care | Payer: Self-pay | Source: Home / Self Care | Attending: Gastroenterology

## 2019-05-30 ENCOUNTER — Ambulatory Visit
Admission: RE | Admit: 2019-05-30 | Discharge: 2019-05-30 | Disposition: A | Discharge status: Home / Self Care | Payer: 59 | Attending: Gastroenterology | Admitting: Gastroenterology

## 2019-05-30 DIAGNOSIS — F329 Major depressive disorder, single episode, unspecified: Secondary | ICD-10-CM | POA: Insufficient documentation

## 2019-05-30 DIAGNOSIS — Z791 Long term (current) use of non-steroidal anti-inflammatories (NSAID): Secondary | ICD-10-CM | POA: Diagnosis not present

## 2019-05-30 DIAGNOSIS — K219 Gastro-esophageal reflux disease without esophagitis: Secondary | ICD-10-CM | POA: Diagnosis not present

## 2019-05-30 DIAGNOSIS — Z79899 Other long term (current) drug therapy: Secondary | ICD-10-CM | POA: Diagnosis not present

## 2019-05-30 DIAGNOSIS — A630 Anogenital (venereal) warts: Secondary | ICD-10-CM | POA: Diagnosis not present

## 2019-05-30 DIAGNOSIS — D123 Benign neoplasm of transverse colon: Secondary | ICD-10-CM | POA: Insufficient documentation

## 2019-05-30 DIAGNOSIS — F419 Anxiety disorder, unspecified: Secondary | ICD-10-CM | POA: Diagnosis not present

## 2019-05-30 DIAGNOSIS — R194 Change in bowel habit: Secondary | ICD-10-CM | POA: Diagnosis not present

## 2019-05-30 DIAGNOSIS — D121 Benign neoplasm of appendix: Secondary | ICD-10-CM | POA: Diagnosis not present

## 2019-05-30 DIAGNOSIS — K529 Noninfective gastroenteritis and colitis, unspecified: Secondary | ICD-10-CM | POA: Diagnosis not present

## 2019-05-30 DIAGNOSIS — G4730 Sleep apnea, unspecified: Secondary | ICD-10-CM | POA: Insufficient documentation

## 2019-05-30 DIAGNOSIS — K635 Polyp of colon: Secondary | ICD-10-CM | POA: Diagnosis not present

## 2019-05-30 DIAGNOSIS — D120 Benign neoplasm of cecum: Secondary | ICD-10-CM | POA: Diagnosis not present

## 2019-05-30 DIAGNOSIS — K6289 Other specified diseases of anus and rectum: Secondary | ICD-10-CM | POA: Diagnosis not present

## 2019-05-30 DIAGNOSIS — R69 Illness, unspecified: Secondary | ICD-10-CM | POA: Diagnosis not present

## 2019-05-30 DIAGNOSIS — R197 Diarrhea, unspecified: Secondary | ICD-10-CM | POA: Diagnosis not present

## 2019-05-30 HISTORY — PX: COLONOSCOPY WITH PROPOFOL: SHX5780

## 2019-05-30 LAB — POCT PREGNANCY, URINE: Preg Test, Ur: NEGATIVE

## 2019-05-30 SURGERY — COLONOSCOPY WITH PROPOFOL
Anesthesia: General

## 2019-05-30 MED ORDER — EPHEDRINE SULFATE 50 MG/ML IJ SOLN
INTRAMUSCULAR | Status: AC
  Filled 2019-05-30: qty 1

## 2019-05-30 MED ORDER — EPHEDRINE SULFATE 50 MG/ML IJ SOLN
INTRAMUSCULAR | Status: DC | PRN
  Administered 2019-05-30 (×3): 5 mg via INTRAVENOUS

## 2019-05-30 MED ORDER — MIDAZOLAM HCL 2 MG/2ML IJ SOLN
INTRAMUSCULAR | Status: AC
  Filled 2019-05-30: qty 2

## 2019-05-30 MED ORDER — PROPOFOL 500 MG/50ML IV EMUL
INTRAVENOUS | Status: DC | PRN
  Administered 2019-05-30: 120 ug/kg/min via INTRAVENOUS

## 2019-05-30 MED ORDER — FENTANYL CITRATE (PF) 100 MCG/2ML IJ SOLN
INTRAMUSCULAR | Status: DC | PRN
  Administered 2019-05-30: 50 ug via INTRAVENOUS

## 2019-05-30 MED ORDER — FENTANYL CITRATE (PF) 100 MCG/2ML IJ SOLN
INTRAMUSCULAR | Status: AC
  Filled 2019-05-30: qty 2

## 2019-05-30 MED ORDER — SODIUM CHLORIDE 0.9 % IV SOLN
INTRAVENOUS | Status: DC
  Administered 2019-05-30: 10:00:00 1000 mL via INTRAVENOUS

## 2019-05-30 MED ORDER — PROPOFOL 500 MG/50ML IV EMUL
INTRAVENOUS | Status: AC
  Filled 2019-05-30: qty 50

## 2019-05-30 MED ORDER — MIDAZOLAM HCL 2 MG/2ML IJ SOLN
INTRAMUSCULAR | Status: DC | PRN
  Administered 2019-05-30: 2 mg via INTRAVENOUS

## 2019-05-30 NOTE — Anesthesia Preprocedure Evaluation (Signed)
Anesthesia Evaluation  Patient identified by MRN, date of birth, ID band Patient awake    Reviewed: Allergy & Precautions, NPO status , Patient's Chart, lab work & pertinent test results, reviewed documented beta blocker date and time   Airway Mallampati: III  TM Distance: >3 FB     Dental  (+) Chipped   Pulmonary sleep apnea ,           Cardiovascular + Valvular Problems/Murmurs      Neuro/Psych PSYCHIATRIC DISORDERS Anxiety Depression    GI/Hepatic GERD  ,  Endo/Other    Renal/GU      Musculoskeletal   Abdominal   Peds  Hematology   Anesthesia Other Findings Takes B-BLOCKERS.  Reproductive/Obstetrics                             Anesthesia Physical Anesthesia Plan  ASA: III  Anesthesia Plan: General   Post-op Pain Management:    Induction: Intravenous  PONV Risk Score and Plan:   Airway Management Planned:   Additional Equipment:   Intra-op Plan:   Post-operative Plan:   Informed Consent: I have reviewed the patients History and Physical, chart, labs and discussed the procedure including the risks, benefits and alternatives for the proposed anesthesia with the patient or authorized representative who has indicated his/her understanding and acceptance.       Plan Discussed with: CRNA  Anesthesia Plan Comments:         Anesthesia Quick Evaluation

## 2019-05-30 NOTE — Anesthesia Post-op Follow-up Note (Signed)
Anesthesia QCDR form completed.        

## 2019-05-30 NOTE — H&P (Signed)
Outpatient short stay form Pre-procedure 05/30/2019 10:47 AM Lollie Sails MD  Primary Physician: Dr. Olivia Mackie Mclean-Scocuzza  Reason for visit: Colonoscopy  History of present illness: Patient is a 48 year old female presenting today for colonoscopy in regards to her history of binging bowel habits and loose stools.  It is of note that milk seems to increase her symptoms.  It is also of note that she has a daughter age 61 with a history of Crohn's disease.  She tolerated her prep well.  She takes no aspirin or blood thinning agent.    Current Facility-Administered Medications:  .  0.9 %  sodium chloride infusion, , Intravenous, Continuous, Lollie Sails, MD, Last Rate: 20 mL/hr at 05/30/19 1024, 1,000 mL at 05/30/19 1024  Medications Prior to Admission  Medication Sig Dispense Refill Last Dose  . baclofen (LIORESAL) 10 MG tablet Take 10 mg by mouth 3 (three) times daily as needed for muscle spasms.   Past Week at Unknown time  . buPROPion (WELLBUTRIN XL) 150 MG 24 hr tablet Take 3 tablets (450 mg total) by mouth daily. 270 tablet 3 05/29/2019 at Unknown time  . Cholecalciferol (VITAMIN D3) 1.25 MG (50000 UT) TABS Take by mouth.   Past Week at Unknown time  . desvenlafaxine (PRISTIQ) 100 MG 24 hr tablet Take 1 tablet (100 mg total) by mouth daily. 90 tablet 3 05/29/2019 at Unknown time  . dicyclomine (BENTYL) 10 MG capsule Take 10 mg by mouth 4 (four) times daily -  before meals and at bedtime.   Past Week at Unknown time  . LORazepam (ATIVAN) 1 MG tablet Take 1 tablet (1 mg total) by mouth 2 (two) times daily as needed for anxiety. 60 tablet 2 Past Week at Unknown time  . meloxicam (MOBIC) 7.5 MG tablet Take by mouth daily.    Past Week at Unknown time  . metoprolol succinate (TOPROL-XL) 50 MG 24 hr tablet Take 1 tablet (50 mg total) by mouth daily. Take with or immediately following a meal. 90 tablet 3 05/30/2019 at Unknown time  . Multiple Vitamins-Minerals (VITAMIN D3 COMPLETE PO)  Take 5,000 Units by mouth daily.   Past Week at Unknown time  . omeprazole (PRILOSEC) 20 MG capsule Take 1 capsule (20 mg total) by mouth daily. 30 minutes before food 90 capsule 3 05/29/2019 at Unknown time  . selenium 200 MCG TABS tablet Take by mouth daily.   Past Week at Unknown time     No Known Allergies   Past Medical History:  Diagnosis Date  . Allergy   . Anxiety and depression 08/27/2015  . Ejection murmur 08/27/2015  . GERD (gastroesophageal reflux disease)   . Hx of migraines   . Prolapsing mitral leaflet syndrome 08/27/2015  . Sleep apnea 08/27/2015  . Urine incontinence     Review of systems:      Physical Exam    Heart and lungs: Regular rate and rhythm without rub or gallop lungs are bilaterally clear.    HEENT: Normocephalic atraumatic eyes are anicteric    Other:    Pertinant exam for procedure: Soft nontender nondistended bowel sounds positive normoactive    Planned proceedures: Colonoscopy and indicated procedures. I have discussed the risks benefits and complications of procedures to include not limited to bleeding, infection, perforation and the risk of sedation and the patient wishes to proceed.    Lollie Sails, MD Gastroenterology 05/30/2019  10:47 AM

## 2019-05-30 NOTE — Anesthesia Postprocedure Evaluation (Signed)
Anesthesia Post Note  Patient: Carol Stevens  Procedure(s) Performed: COLONOSCOPY WITH PROPOFOL (N/A )  Patient location during evaluation: Endoscopy Anesthesia Type: General Level of consciousness: awake and alert Pain management: pain level controlled Vital Signs Assessment: post-procedure vital signs reviewed and stable Respiratory status: spontaneous breathing, nonlabored ventilation, respiratory function stable and patient connected to nasal cannula oxygen Cardiovascular status: blood pressure returned to baseline and stable Postop Assessment: no apparent nausea or vomiting Anesthetic complications: no     Last Vitals:  Vitals:   05/30/19 1150 05/30/19 1200  BP: (!) 103/58 100/73  Pulse: 64 61  Resp: 14 16  Temp:    SpO2: 98% 99%    Last Pain:  Vitals:   05/30/19 1130  TempSrc: Oral  PainSc:                  Ercelle Winkles S

## 2019-05-30 NOTE — Op Note (Signed)
[facility] Gastroenterology Patient Name: Carol Stevens Procedure Date: 05/30/2019 10:48 AM MRN: [id number] Account #: [id number] Date of Birth: Apr 06, 1971 Admit Type: Outpatient Age: 48 Room: [facility] ENDO ROOM 3 Gender: Female Note Status: Finalized Procedure:            Colonoscopy Indications:          Chronic diarrhea, Change in bowel habits Providers:            Lollie Sails, MD Referring MD:         Nino Glow Mclean-Scocuzza MD, MD (Referring MD) Medicines:            Monitored Anesthesia Care Complications:        No immediate complications. Procedure:            Pre-Anesthesia Assessment:                       - ASA Grade Assessment: II - A patient with mild                        systemic disease.                       After obtaining informed consent, the colonoscope was                        passed under direct vision. Throughout the procedure,                        the patient's blood pressure, pulse, and oxygen                        saturations were monitored continuously. The                        Colonoscope was introduced through the anus and                        advanced to the the cecum, identified by appendiceal                        orifice and ileocecal valve. The colonoscopy was                        performed without difficulty. The patient tolerated the                        procedure well. The quality of the bowel preparation                        was good. Findings:      A 3 mm polyp was found in the splenic flexure. The polyp was sessile.       The polyp was removed with a piecemeal technique using a cold biopsy       forceps. Resection and retrieval were complete.      A 4 mm polyp was found in the appendiceal orifice. The polyp was       sessile. The polyp was removed with a piecemeal technique using a cold       biopsy forceps. Resection and retrieval were complete.      A 3 mm polyp was  found in the rectum,anal  verge. The polyp was sessile.       The polyp was removed with a cold biopsy forceps. Resection and       retrieval were complete.      The retroflexed view of the distal rectum and anal verge was normal and       showed no anal or rectal abnormalities.      The digital rectal exam was normal otherwise. Impression:           - One 3 mm polyp at the splenic flexure, removed                        piecemeal using a cold biopsy forceps. Resected and                        retrieved.                       - One 4 mm polyp at the appendiceal orifice, removed                        piecemeal using a cold biopsy forceps. Resected and                        retrieved.                       - One 3 mm polyp in the rectum, removed with a cold                        biopsy forceps. Resected and retrieved.                       - The distal rectum and anal verge are normal on                        retroflexion view. Recommendation:       - Discharge patient to home.                       - Await pathology results. Procedure Code(s):    --- Professional ---                       [phone], Colonoscopy, flexible; with biopsy, single or                        multiple CPT copyright 2019 American Medical Association. All rights reserved. The codes documented in this report are preliminary and upon coder review may  be revised to meet current compliance requirements. Lollie Sails, MD 05/30/2019 11:34:54 AM This report has been signed electronically. Number of Addenda: 0 Note Initiated On: 05/30/2019 10:48 AM Scope Withdrawal Time: 0 hours 16 minutes 40 seconds  Total Procedure Duration: 0 hours 25 minutes 48 seconds       [facility]

## 2019-05-30 NOTE — Anesthesia Procedure Notes (Signed)
Performed by: Cook-Martin, Mychael Soots Pre-anesthesia Checklist: Patient identified, Emergency Drugs available, Suction available, Patient being monitored and Timeout performed Patient Re-evaluated:Patient Re-evaluated prior to induction Oxygen Delivery Method: Nasal cannula Preoxygenation: Pre-oxygenation with 100% oxygen Induction Type: IV induction Placement Confirmation: positive ETCO2 and CO2 detector       

## 2019-05-30 NOTE — Transfer of Care (Signed)
Immediate Anesthesia Transfer of Care Note  Patient: Carol Stevens  Procedure(s) Performed: COLONOSCOPY WITH PROPOFOL (N/A )  Patient Location: PACU  Anesthesia Type:General  Level of Consciousness: awake and sedated  Airway & Oxygen Therapy: Patient Spontanous Breathing and Patient connected to nasal cannula oxygen  Post-op Assessment: Report given to RN and Post -op Vital signs reviewed and stable  Post vital signs: Reviewed and stable  Last Vitals:  Vitals Value Taken Time  BP    Temp    Pulse    Resp    SpO2      Last Pain:  Vitals:   05/30/19 1009  TempSrc: Oral  PainSc: 0-No pain         Complications: No apparent anesthesia complications

## 2019-05-31 LAB — SURGICAL PATHOLOGY

## 2019-06-02 DIAGNOSIS — R194 Change in bowel habit: Secondary | ICD-10-CM | POA: Diagnosis not present

## 2019-06-12 DIAGNOSIS — Z86010 Personal history of colonic polyps: Secondary | ICD-10-CM | POA: Diagnosis not present

## 2019-06-12 DIAGNOSIS — R69 Illness, unspecified: Secondary | ICD-10-CM | POA: Diagnosis not present

## 2019-06-22 ENCOUNTER — Other Ambulatory Visit: Payer: Self-pay

## 2019-06-22 ENCOUNTER — Encounter: Payer: Self-pay | Admitting: Family Medicine

## 2019-06-22 ENCOUNTER — Ambulatory Visit (INDEPENDENT_AMBULATORY_CARE_PROVIDER_SITE_OTHER): Payer: 59 | Admitting: Family Medicine

## 2019-06-22 VITALS — BP 126/76 | HR 75 | Ht 64.0 in | Wt 199.8 lb

## 2019-06-22 DIAGNOSIS — Z1151 Encounter for screening for human papillomavirus (HPV): Secondary | ICD-10-CM

## 2019-06-22 DIAGNOSIS — N920 Excessive and frequent menstruation with regular cycle: Secondary | ICD-10-CM | POA: Insufficient documentation

## 2019-06-22 DIAGNOSIS — Z124 Encounter for screening for malignant neoplasm of cervix: Secondary | ICD-10-CM

## 2019-06-22 DIAGNOSIS — Z01419 Encounter for gynecological examination (general) (routine) without abnormal findings: Secondary | ICD-10-CM | POA: Diagnosis not present

## 2019-06-22 NOTE — Assessment & Plan Note (Signed)
Likely related to pre-menopause, though will check pelvic sono. If gets heavier or abnl u/s, will consider EMB. Normal clinical transitions of menopause discussed, at length.

## 2019-06-22 NOTE — Progress Notes (Signed)
Last Pap-unsure of date, maybe a few years ago mammo due 10/20-already has referral  Irregular periods - for the last 3-4 months  Flu shot declined - 06/22/2019

## 2019-06-22 NOTE — Patient Instructions (Signed)

## 2019-06-22 NOTE — Progress Notes (Signed)
  Subjective:     Carol Stevens is a 48 y.o. female and is here for a comprehensive physical exam. The patient reports problems - more frequent cycles. Previously regular q 28 days. Last 3-4 months, has begun coming q 2.5-3 wks, but not any heavier. Some spotting after stopping her flow. Nml FSH. S/p BTL. SVD x 2. Works as a Occupational psychologist. Daughter is pregnant and comes here for care.  The following portions of the patient's history were reviewed and updated as appropriate: allergies, current medications, past family history, past medical history, past social history, past surgical history and problem list.  Review of Systems Pertinent items noted in HPI and remainder of comprehensive ROS otherwise negative.   Objective:    BP 126/76   Pulse 75   Ht 5\' 4"  (1.626 m)   Wt 199 lb 12.8 oz (90.6 kg)   LMP 06/14/2019 (Exact Date)   BMI 34.30 kg/m  General appearance: alert, cooperative and appears stated age Head: Normocephalic, without obvious abnormality, atraumatic Neck: no adenopathy, supple, symmetrical, trachea midline and thyroid not enlarged, symmetric, no tenderness/mass/nodules Lungs: clear to auscultation bilaterally Breasts: normal appearance, no masses or tenderness Heart: regular rate and rhythm, S1, S2 normal, no murmur, click, rub or gallop Abdomen: soft, non-tender; bowel sounds normal; no masses,  no organomegaly Pelvic: cervix normal in appearance, external genitalia normal, no adnexal masses or tenderness, no cervical motion tenderness, uterus normal size, shape, and consistency and vagina normal without discharge Extremities: extremities normal, atraumatic, no cyanosis or edema Pulses: 2+ and symmetric Skin: Skin color, texture, turgor normal. No rashes or lesions Lymph nodes: Cervical, supraclavicular, and axillary nodes normal. Neurologic: Grossly normal    Assessment:    Healthy female exam.      Plan:   Problem List Items Addressed This Visit      Unprioritized   Polymenorrhea - Primary    Likely related to pre-menopause, though will check pelvic sono. If gets heavier or abnl u/s, will consider EMB. Normal clinical transitions of menopause discussed, at length.      Relevant Orders   US PELVIC COMPLETE WITH TRANSVAGINAL    Other Visit Diagnoses    Screening for malignant neoplasm of cervix       Relevant Orders   Cytology - PAP( [place])   Encounter for gynecological examination without abnormal finding         Return in 1 year (on 06/21/2020).    See After Visit Summary for Counseling Recommendations

## 2019-06-26 LAB — CYTOLOGY - PAP
Adequacy: ABSENT
Diagnosis: NEGATIVE
HPV: NOT DETECTED

## 2019-06-30 ENCOUNTER — Ambulatory Visit: Payer: 59

## 2019-07-14 ENCOUNTER — Other Ambulatory Visit: Payer: Self-pay

## 2019-07-14 ENCOUNTER — Ambulatory Visit
Admission: RE | Admit: 2019-07-14 | Discharge: 2019-07-14 | Disposition: A | Discharge status: Home / Self Care | Payer: 59 | Source: Ambulatory Visit | Attending: Family Medicine | Admitting: Family Medicine

## 2019-07-14 DIAGNOSIS — N920 Excessive and frequent menstruation with regular cycle: Secondary | ICD-10-CM | POA: Diagnosis not present

## 2019-08-14 ENCOUNTER — Ambulatory Visit
Admission: RE | Admit: 2019-08-14 | Discharge: 2019-08-14 | Disposition: A | Discharge status: Home / Self Care | Payer: 59 | Source: Ambulatory Visit | Attending: Internal Medicine | Admitting: Internal Medicine

## 2019-08-14 DIAGNOSIS — Z1231 Encounter for screening mammogram for malignant neoplasm of breast: Secondary | ICD-10-CM | POA: Diagnosis not present

## 2019-09-11 DIAGNOSIS — S82851A Displaced trimalleolar fracture of right lower leg, initial encounter for closed fracture: Secondary | ICD-10-CM | POA: Insufficient documentation

## 2019-09-12 HISTORY — PX: ANKLE FRACTURE SURGERY: SHX122

## 2019-11-03 ENCOUNTER — Ambulatory Visit: Payer: 59 | Admitting: Internal Medicine

## 2020-03-20 ENCOUNTER — Other Ambulatory Visit: Payer: Self-pay | Admitting: Internal Medicine

## 2020-03-20 DIAGNOSIS — F419 Anxiety disorder, unspecified: Secondary | ICD-10-CM

## 2020-03-20 MED ORDER — LORAZEPAM 1 MG PO TABS: 1.0000 mg | ORAL_TABLET | Freq: Two times a day (BID) | ORAL | 2 refills | Status: DC | PRN

## 2020-04-30 ENCOUNTER — Encounter: Payer: Self-pay | Admitting: Radiology

## 2020-07-29 ENCOUNTER — Other Ambulatory Visit: Payer: Self-pay | Admitting: Internal Medicine

## 2020-07-29 DIAGNOSIS — Z1231 Encounter for screening mammogram for malignant neoplasm of breast: Secondary | ICD-10-CM

## 2020-07-30 ENCOUNTER — Other Ambulatory Visit: Payer: Self-pay

## 2020-07-30 DIAGNOSIS — F32A Depression, unspecified: Secondary | ICD-10-CM

## 2020-07-30 DIAGNOSIS — F419 Anxiety disorder, unspecified: Secondary | ICD-10-CM

## 2020-07-30 DIAGNOSIS — K219 Gastro-esophageal reflux disease without esophagitis: Secondary | ICD-10-CM

## 2020-07-30 DIAGNOSIS — I341 Nonrheumatic mitral (valve) prolapse: Secondary | ICD-10-CM

## 2020-07-30 MED ORDER — DESVENLAFAXINE SUCCINATE ER 100 MG PO TB24: 100.0000 mg | ORAL_TABLET | Freq: Every day | ORAL | 3 refills | Status: DC

## 2020-07-30 MED ORDER — OMEPRAZOLE 20 MG PO CPDR: 20.0000 mg | DELAYED_RELEASE_CAPSULE | Freq: Every day | ORAL | 3 refills | Status: DC

## 2020-07-30 MED ORDER — BUPROPION HCL ER (XL) 150 MG PO TB24: 450.0000 mg | ORAL_TABLET | Freq: Every day | ORAL | 3 refills | Status: DC

## 2020-07-30 MED ORDER — METOPROLOL SUCCINATE ER 50 MG PO TB24: 50.0000 mg | ORAL_TABLET | Freq: Every day | ORAL | 3 refills | Status: DC

## 2020-08-01 DIAGNOSIS — M62461 Contracture of muscle, right lower leg: Secondary | ICD-10-CM | POA: Insufficient documentation

## 2020-08-14 IMAGING — MG MM DIGITAL SCREENING BILAT W/ TOMO W/ CAD
6 of 10 series · 6 of 30 positions shown · non-contrast
Comparison: Previous exam(s).

CLINICAL DATA: Screening.

EXAM:
DIGITAL SCREENING BILATERAL MAMMOGRAM WITH TOMO AND CAD

[R CC synth-2D]
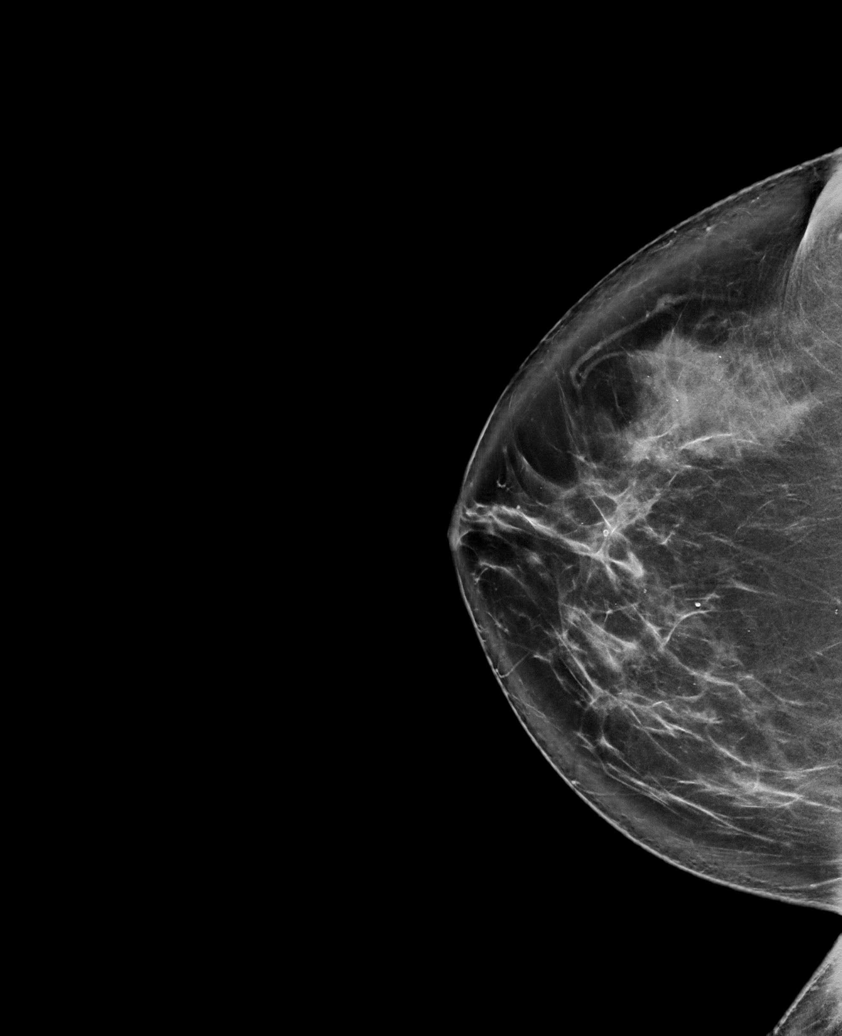

[R MLO synth-2D (1 of 2)]
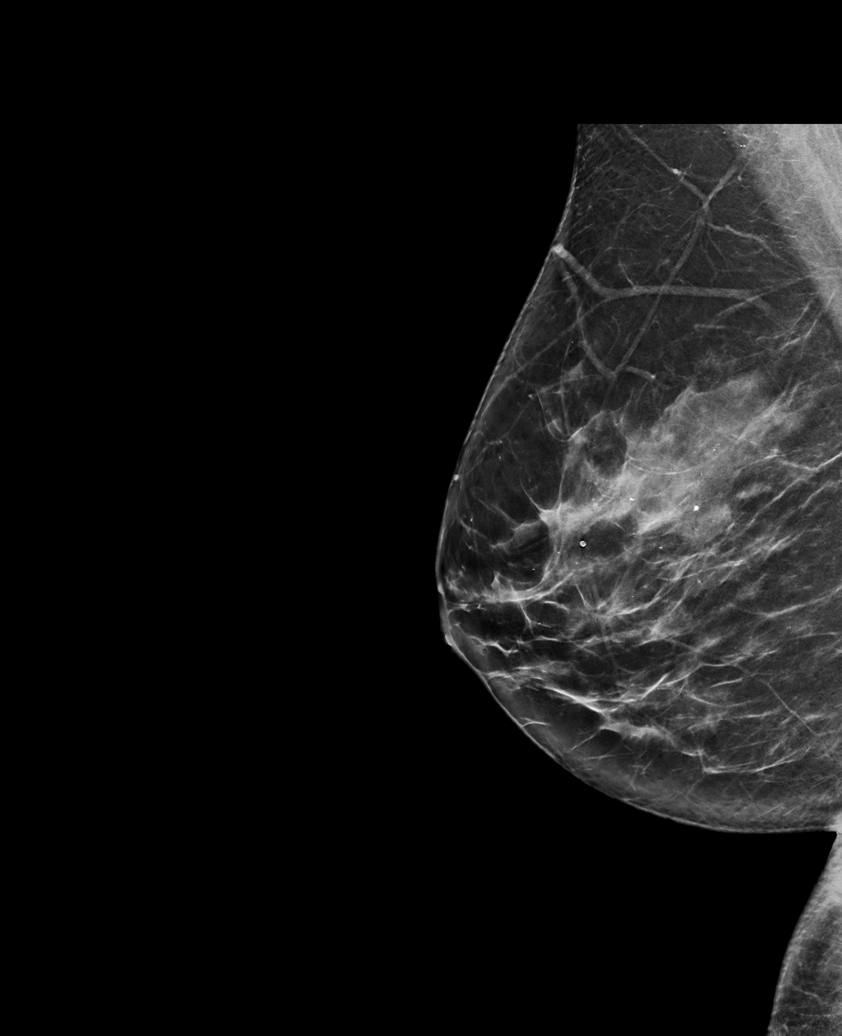

[L CC synth-2D]
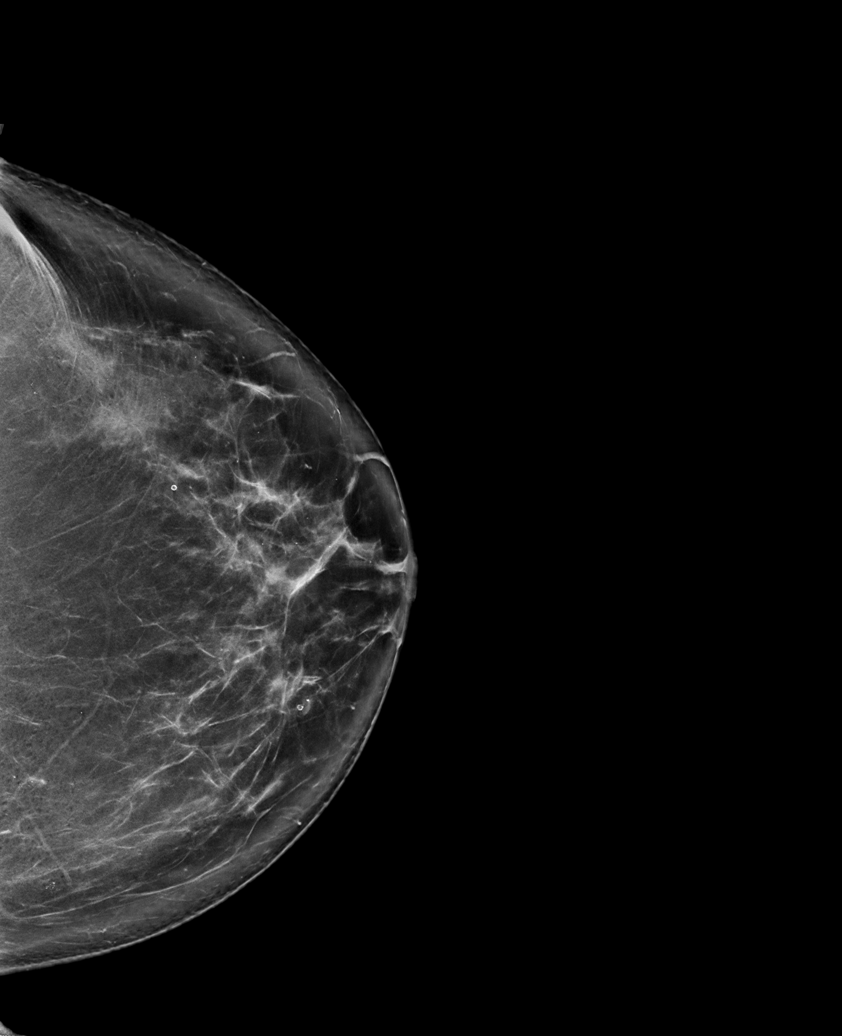

[L MLO synth-2D]
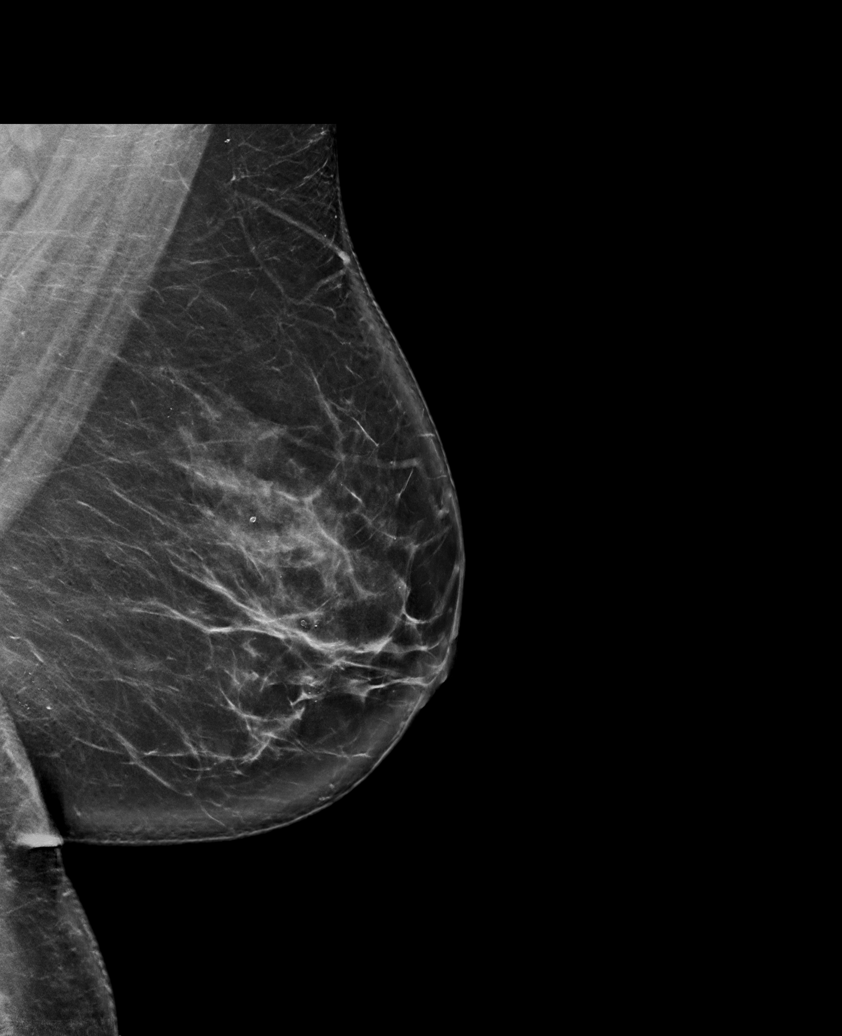

[R MLO synth-2D (2 of 2)]
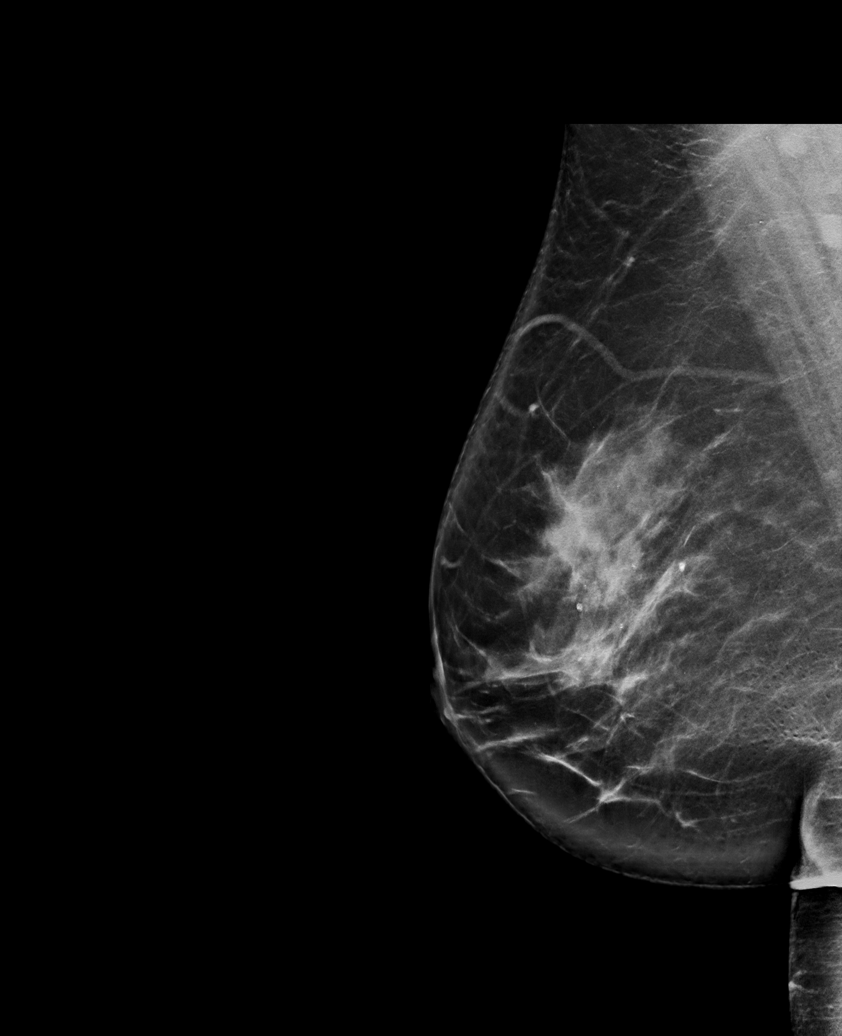

[R CC tomo · tomo slice 47/93.0]
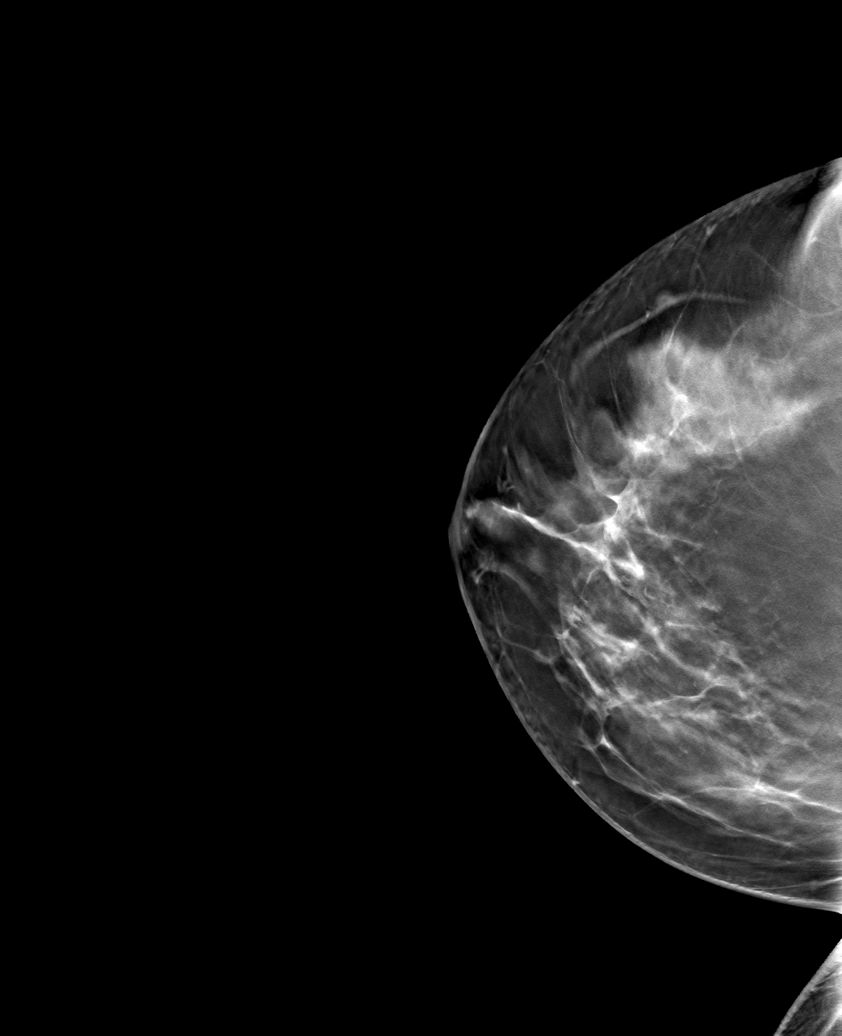

[6 of 30 positions shown; findings below may reference images not displayed]

ACR Breast Density Category b: There are scattered areas of
fibroglandular density.
FINDINGS: There are no findings suspicious for malignancy. Images were
processed with CAD.
IMPRESSION: No mammographic evidence of malignancy. A result letter of this
screening mammogram will be mailed directly to the patient.

RECOMMENDATION:
Screening mammogram in one year. (Code:[id number])

BI-RADS CATEGORY  1: Negative.

## 2020-08-28 ENCOUNTER — Ambulatory Visit
Admission: RE | Admit: 2020-08-28 | Discharge: 2020-08-28 | Disposition: A | Discharge status: Home / Self Care | Payer: No Typology Code available for payment source | Source: Ambulatory Visit | Attending: Internal Medicine | Admitting: Internal Medicine

## 2020-08-28 ENCOUNTER — Other Ambulatory Visit: Payer: Self-pay

## 2020-08-28 DIAGNOSIS — Z1231 Encounter for screening mammogram for malignant neoplasm of breast: Secondary | ICD-10-CM

## 2020-08-30 HISTORY — PX: ACHILLES TENDON SURGERY: SHX542

## 2020-09-02 ENCOUNTER — Other Ambulatory Visit: Payer: Self-pay | Admitting: Internal Medicine

## 2020-09-02 DIAGNOSIS — R921 Mammographic calcification found on diagnostic imaging of breast: Secondary | ICD-10-CM

## 2020-09-02 DIAGNOSIS — R928 Other abnormal and inconclusive findings on diagnostic imaging of breast: Secondary | ICD-10-CM

## 2020-09-13 ENCOUNTER — Ambulatory Visit
Admission: RE | Admit: 2020-09-13 | Discharge: 2020-09-13 | Disposition: A | Discharge status: Home / Self Care | Payer: No Typology Code available for payment source | Source: Ambulatory Visit | Attending: Internal Medicine | Admitting: Internal Medicine

## 2020-09-13 ENCOUNTER — Other Ambulatory Visit: Payer: Self-pay

## 2020-09-13 DIAGNOSIS — R928 Other abnormal and inconclusive findings on diagnostic imaging of breast: Secondary | ICD-10-CM

## 2020-09-13 DIAGNOSIS — R921 Mammographic calcification found on diagnostic imaging of breast: Secondary | ICD-10-CM

## 2020-11-18 ENCOUNTER — Other Ambulatory Visit: Payer: Self-pay | Admitting: Internal Medicine

## 2020-11-18 ENCOUNTER — Telehealth: Payer: Self-pay | Admitting: Internal Medicine

## 2020-11-18 DIAGNOSIS — F419 Anxiety disorder, unspecified: Secondary | ICD-10-CM

## 2020-11-18 NOTE — Telephone Encounter (Signed)
Noted Letter was still mailed out as a reminder

## 2020-11-18 NOTE — Telephone Encounter (Signed)
Pt needs to sch physical and fasting for labs not seen in > 1 year further refills as well  Please call to schedule  Temp supply of anxiety medication sent to the pharmacy until she has appt for f/u   Thank you

## 2020-11-18 NOTE — Telephone Encounter (Signed)
Please mail letter pt to have fasting labs and MD appt at the same visit  Needs am visit  Please call to schedule and see prior notes Carol Stevens anytime you cant reach the patient please send a letter thank you

## 2020-11-18 NOTE — Telephone Encounter (Signed)
Unable to lvm to set up physical and labs due to mailbox not being set up

## 2020-11-18 NOTE — Telephone Encounter (Signed)
Pt called in and I let her know she needed to schedule cpe and labs. Pt said she was going into physical therapy and will call back to schedule appointment.

## 2021-05-09 ENCOUNTER — Other Ambulatory Visit: Payer: Self-pay

## 2021-05-09 ENCOUNTER — Ambulatory Visit: Payer: 59 | Admitting: Anesthesiology

## 2021-05-09 ENCOUNTER — Encounter
Admission: RE | Disposition: A | Discharge status: Home / Self Care | Payer: Self-pay | Source: Home / Self Care | Attending: Gastroenterology

## 2021-05-09 ENCOUNTER — Ambulatory Visit
Admission: RE | Admit: 2021-05-09 | Discharge: 2021-05-09 | Disposition: A | Discharge status: Home / Self Care | Payer: 59 | Attending: Gastroenterology | Admitting: Gastroenterology

## 2021-05-09 ENCOUNTER — Encounter: Payer: Self-pay | Admitting: *Deleted

## 2021-05-09 DIAGNOSIS — Z791 Long term (current) use of non-steroidal anti-inflammatories (NSAID): Secondary | ICD-10-CM | POA: Insufficient documentation

## 2021-05-09 DIAGNOSIS — Z79899 Other long term (current) drug therapy: Secondary | ICD-10-CM | POA: Insufficient documentation

## 2021-05-09 DIAGNOSIS — Z8719 Personal history of other diseases of the digestive system: Secondary | ICD-10-CM | POA: Diagnosis not present

## 2021-05-09 DIAGNOSIS — Z09 Encounter for follow-up examination after completed treatment for conditions other than malignant neoplasm: Secondary | ICD-10-CM | POA: Insufficient documentation

## 2021-05-09 HISTORY — PX: FLEXIBLE SIGMOIDOSCOPY: SHX5431

## 2021-05-09 LAB — POCT PREGNANCY, URINE: Preg Test, Ur: NEGATIVE

## 2021-05-09 SURGERY — SIGMOIDOSCOPY, FLEXIBLE
Anesthesia: Monitor Anesthesia Care

## 2021-05-09 MED ORDER — SODIUM CHLORIDE 0.9 % IV SOLN: INTRAVENOUS | Status: DC

## 2021-05-09 MED ORDER — PROPOFOL 500 MG/50ML IV EMUL
INTRAVENOUS | Status: DC | PRN
  Administered 2021-05-09: 150 ug/kg/min via INTRAVENOUS

## 2021-05-09 MED ORDER — PHENYLEPHRINE HCL (PRESSORS) 10 MG/ML IV SOLN
INTRAVENOUS | Status: DC | PRN
  Administered 2021-05-09: 50 ug via INTRAVENOUS

## 2021-05-09 MED ORDER — MIDAZOLAM HCL 2 MG/2ML IJ SOLN
INTRAMUSCULAR | Status: DC | PRN
  Administered 2021-05-09: 2 mg via INTRAVENOUS

## 2021-05-09 MED ORDER — MIDAZOLAM HCL 2 MG/2ML IJ SOLN
INTRAMUSCULAR | Status: AC
  Filled 2021-05-09: qty 2

## 2021-05-09 MED ORDER — PROPOFOL 500 MG/50ML IV EMUL
INTRAVENOUS | Status: AC
  Filled 2021-05-09: qty 50

## 2021-05-09 MED ORDER — PROPOFOL 10 MG/ML IV BOLUS
INTRAVENOUS | Status: DC | PRN
  Administered 2021-05-09: 50 mg via INTRAVENOUS

## 2021-05-09 MED ORDER — LIDOCAINE HCL (CARDIAC) PF 100 MG/5ML IV SOSY
PREFILLED_SYRINGE | INTRAVENOUS | Status: DC | PRN
  Administered 2021-05-09: 50 mg via INTRAVENOUS

## 2021-05-09 MED ORDER — LIDOCAINE HCL (PF) 2 % IJ SOLN
INTRAMUSCULAR | Status: AC
  Filled 2021-05-09: qty 5

## 2021-05-09 NOTE — Transfer of Care (Signed)
Immediate Anesthesia Transfer of Care Note  Patient: Carol Stevens  Procedure(s) Performed: FLEXIBLE SIGMOIDOSCOPY  Patient Location: PACU  Anesthesia Type:General  Level of Consciousness: drowsy  Airway & Oxygen Therapy: Patient Spontanous Breathing and Patient connected to face mask oxygen  Post-op Assessment: Report given to RN and Post -op Vital signs reviewed and stable  Post vital signs: Reviewed and stable  Last Vitals:  Vitals Value Taken Time  BP 102/55 05/09/21 1354  Temp    Pulse 64 05/09/21 1354  Resp 18 05/09/21 1354  SpO2 100 % 05/09/21 1354  Vitals shown include unvalidated device data.  Last Pain:  Vitals:   05/09/21 1255  TempSrc: Temporal  PainSc: 0-No pain         Complications: No notable events documented.

## 2021-05-09 NOTE — Interval H&P Note (Signed)
History and Physical Interval Note:  05/09/2021 1:38 PM  Carol Stevens  has presented today for surgery, with the diagnosis of History of Colon Polyps Anal Condylomata.  The various methods of treatment have been discussed with the patient and family. After consideration of risks, benefits and other options for treatment, the patient has consented to  Procedure(s): FLEXIBLE SIGMOIDOSCOPY (N/A) as a surgical intervention.  The patient's history has been reviewed, patient examined, no change in status, stable for surgery.  I have reviewed the patient's chart and labs.  Questions were answered to the patient's satisfaction.     Lesly Rubenstein  Ok to proceed with flex sig

## 2021-05-09 NOTE — H&P (Signed)
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 No blood thinners. No significant abdominal surgeries. No significant symptoms.    Current Facility-Administered Medications:    0.9 %  sodium chloride infusion, , Intravenous, Continuous, Mohmmad Saleeby, Hilton Cork, MD, Last Rate: 20 mL/hr at 05/09/21 1308, New Bag at 05/09/21 1308  Medications Prior to Admission  Medication Sig Dispense Refill Last Dose   buPROPion (WELLBUTRIN XL) 150 MG 24 hr tablet Take 3 tablets (450 mg total) by mouth daily. 270 tablet 3 05/08/2021 at 0800   desvenlafaxine (PRISTIQ) 100 MG 24 hr tablet Take 1 tablet (100 mg total) by mouth daily. 90 tablet 3 05/08/2021 at 0800   dicyclomine (BENTYL) 10 MG capsule Take 10 mg by mouth 4 (four) times daily -  before meals and at bedtime.   Past Week   LORazepam (ATIVAN) 1 MG tablet TAKE 1 TABLET (1 MG TOTAL) BY MOUTH 2 (TWO) TIMES DAILY AS NEEDED FOR ANXIETY. 60 tablet 1 05/08/2021 at 0800   metoprolol succinate (TOPROL-XL) 50 MG 24 hr tablet Take 1 tablet (50 mg total) by mouth daily. Take with or immediately following a meal. 90 tablet 3 05/09/2021 at 0800   omeprazole (PRILOSEC) 20 MG capsule Take 1 capsule (20 mg total) by mouth daily. 30 minutes before food 90 capsule 3 05/08/2021 at 0800   baclofen (LIORESAL) 10 MG tablet Take 10 mg by mouth 3 (three) times daily as needed for muscle spasms. (Patient not taking: Reported on 05/09/2021)   Not Taking   Cholecalciferol (VITAMIN D3) 1.25 MG (50000 UT) TABS Take by mouth.      meloxicam (MOBIC) 7.5 MG tablet Take by mouth daily.       Multiple Vitamins-Minerals (VITAMIN D3 COMPLETE PO) Take 5,000 Units by mouth daily.      selenium 200 MCG TABS tablet Take by mouth daily.        No  Known Allergies   Past Medical History:  Diagnosis Date   Allergy    Anxiety and depression 08/27/2015   Breast disorder    Ejection murmur 08/27/2015   GERD (gastroesophageal reflux disease)    Hx of migraines    Prolapsing mitral leaflet syndrome 08/27/2015   Sleep apnea 08/27/2015   Urine incontinence     Review of systems:  Otherwise negative.    Physical Exam  Gen: Alert, oriented. Appears stated age.  HEENT: PERRLA. Lungs: No respiratory distress CV: RRR Abd: soft, benign, no masses Ext: No edema    Planned procedures: Proceed with flex sig. The patient understands the nature of the planned procedure, indications, risks, alternatives and potential complications including but not limited to bleeding, infection, perforation, damage to internal organs and possible oversedation/side effects from anesthesia. The patient agrees and gives consent to proceed.  Please refer to procedure notes for findings, recommendations and patient disposition/instructions.     Carol Miyamoto MD, MPH Gastroenterology 05/09/2021  1:34 PM

## 2021-05-09 NOTE — Anesthesia Postprocedure Evaluation (Signed)
Anesthesia Post Note  Patient: Carol Stevens  Procedure(s) Performed: [name]  Patient location during evaluation: PACU Anesthesia Type: MAC Level of consciousness: awake and alert Pain management: pain level controlled Vital Signs Assessment: post-procedure vital signs reviewed and stable Respiratory status: spontaneous breathing Cardiovascular status: blood pressure returned to baseline Postop Assessment: no headache Anesthetic complications: no   No notable events documented.   Last Vitals:  Vitals:   05/09/21 1353 05/09/21 1413  BP: (!) 102/55 93/78  Pulse:    Resp:    Temp: 36.8 C   SpO2:      Last Pain:  Vitals:   05/09/21 1413  TempSrc:   PainSc: 0-No pain                 Milinda Pointer

## 2021-05-09 NOTE — Op Note (Signed)
[facility] Patient Name: Carol Stevens Procedure Date: 05/09/2021 1:36 PM MRN: [id number] Account #: [id number] Date of Birth: 11/01/1970 Admit Type: Outpatient Age: 50 Room: [facility] ENDO ROOM 3 Gender: Female Note Status: Finalized Procedure:             Flexible Sigmoidoscopy Indications:           History of low grade squamoues intraepithelial lesion Providers:             Andrey Farmer MD, MD Medicines:             Monitored Anesthesia Care Complications:         No immediate complications. Estimated blood loss:                         Minimal. Procedure:             Pre-Anesthesia Assessment:                        - Prior to the procedure, a History and Physical was                         performed, and patient medications and allergies were                         reviewed. The patient is competent. The risks and                         benefits of the procedure and the sedation options and                         risks were discussed with the patient. All questions                         were answered and informed consent was obtained.                         Patient identification and proposed procedure were                         verified by the physician, the nurse, the anesthetist                         and the technician in the endoscopy suite. Mental                         Status Examination: alert and oriented. Airway                         Examination: normal oropharyngeal airway and neck                         mobility. Respiratory Examination: clear to                         auscultation. CV Examination: normal. Prophylactic                         Antibiotics: The patient does not require prophylactic  antibiotics. Prior Anticoagulants: The patient has                         taken no previous anticoagulant or antiplatelet                         agents. ASA Grade Assessment: I - A normal,  healthy                         patient. After reviewing the risks and benefits, the                         patient was deemed in satisfactory condition to                         undergo the procedure. The anesthesia plan was to use                         monitored anesthesia care (MAC). Immediately prior to                         administration of medications, the patient was                         re-assessed for adequacy to receive sedatives. The                         heart rate, respiratory rate, oxygen saturations,                         blood pressure, adequacy of pulmonary ventilation, and                         response to care were monitored throughout the                         procedure. The physical status of the patient was                         re-assessed after the procedure.                        After obtaining informed consent, the scope was passed                         under direct vision. The Colonoscope was introduced                         through the anus and advanced to the the sigmoid                         colon. The flexible sigmoidoscopy was accomplished                         without difficulty. The patient tolerated the                         procedure well. The quality of the bowel preparation  was good. Findings:      The perianal and digital rectal examinations were normal.      Normal mucosa was found in the rectum. Biopsies were taken with a cold       forceps for histology. Estimated blood loss was minimal. Impression:            - Normal mucosa in the rectum. Biopsied. Recommendation:        - Resume previous diet.                        - Discharge patient to home.                        - Await pathology results.                        - Return to referring physician as previously                         scheduled. Procedure Code(s):     --- Professional ---                        [phone], Sigmoidoscopy,  flexible; with biopsy, single or                         multiple Diagnosis Code(s):     --- Professional ---                        [phone], Low grade squamous intraepithelial lesion on                         cytologic smear of anus (LGSIL) CPT copyright 2019 American Medical Association. All rights reserved. The codes documented in this report are preliminary and upon coder review may  be revised to meet current compliance requirements. Andrey Farmer MD, MD 05/09/2021 1:56:04 PM Number of Addenda: 0 Note Initiated On: 05/09/2021 1:36 PM Total Procedure Duration: 0 hours 5 minutes 31 seconds  Estimated Blood Loss:  Estimated blood loss was minimal.      [facility]

## 2021-05-09 NOTE — Anesthesia Preprocedure Evaluation (Signed)
Anesthesia Evaluation  Patient identified by MRN, date of birth, ID band Patient awake    Reviewed: Allergy & Precautions, NPO status , Patient's Chart, lab work & pertinent test results, reviewed documented beta blocker date and time   Airway Mallampati: III  TM Distance: >3 FB     Dental  (+) Chipped   Pulmonary sleep apnea ,    Pulmonary exam normal - rhonchi (-) wheezing      Cardiovascular Normal cardiovascular exam+ Valvular Problems/Murmurs  Rhythm:Regular Rate:Normal - Systolic murmurs and - Diastolic murmurs    Neuro/Psych PSYCHIATRIC DISORDERS Anxiety Depression    GI/Hepatic GERD  ,  Endo/Other    Renal/GU      Musculoskeletal   Abdominal   Peds  Hematology   Anesthesia Other Findings Takes B-BLOCKERS.  Reproductive/Obstetrics                             Anesthesia Physical  Anesthesia Plan  ASA: 3  Anesthesia Plan: MAC   Post-op Pain Management:    Induction: Intravenous  PONV Risk Score and Plan: 1 and TIVA and Propofol infusion  Airway Management Planned: Mask and Natural Airway  Additional Equipment:   Intra-op Plan:   Post-operative Plan:   Informed Consent: I have reviewed the patients History and Physical, chart, labs and discussed the procedure including the risks, benefits and alternatives for the proposed anesthesia with the patient or authorized representative who has indicated his/her understanding and acceptance.     Dental advisory given  Plan Discussed with: CRNA  Anesthesia Plan Comments: (IVGA, PIV x 1, ASA SM, RBO discussed, Consent signed.)        Anesthesia Quick Evaluation

## 2021-05-12 LAB — SURGICAL PATHOLOGY

## 2021-05-19 ENCOUNTER — Other Ambulatory Visit: Payer: Self-pay | Admitting: Internal Medicine

## 2021-05-19 DIAGNOSIS — F419 Anxiety disorder, unspecified: Secondary | ICD-10-CM

## 2021-05-19 NOTE — Telephone Encounter (Signed)
Refilled: 11/18/2020 Last OV: 05/02/2019 Next OV: not scheduled

## 2021-06-14 ENCOUNTER — Other Ambulatory Visit: Payer: Self-pay | Admitting: Internal Medicine

## 2021-06-14 DIAGNOSIS — I341 Nonrheumatic mitral (valve) prolapse: Secondary | ICD-10-CM

## 2021-06-14 DIAGNOSIS — K219 Gastro-esophageal reflux disease without esophagitis: Secondary | ICD-10-CM

## 2021-07-15 ENCOUNTER — Other Ambulatory Visit: Payer: Self-pay | Admitting: Internal Medicine

## 2021-07-15 DIAGNOSIS — F419 Anxiety disorder, unspecified: Secondary | ICD-10-CM

## 2021-07-15 DIAGNOSIS — F32A Depression, unspecified: Secondary | ICD-10-CM

## 2021-08-05 ENCOUNTER — Ambulatory Visit: Payer: No Typology Code available for payment source | Admitting: Internal Medicine

## 2021-08-26 ENCOUNTER — Other Ambulatory Visit: Payer: Self-pay | Admitting: Internal Medicine

## 2021-08-28 ENCOUNTER — Other Ambulatory Visit: Payer: Self-pay

## 2021-08-28 ENCOUNTER — Telehealth: Payer: Self-pay | Admitting: Internal Medicine

## 2021-08-28 DIAGNOSIS — Z0000 Encounter for general adult medical examination without abnormal findings: Secondary | ICD-10-CM

## 2021-08-28 DIAGNOSIS — Z1389 Encounter for screening for other disorder: Secondary | ICD-10-CM

## 2021-08-28 DIAGNOSIS — E559 Vitamin D deficiency, unspecified: Secondary | ICD-10-CM

## 2021-08-28 DIAGNOSIS — E785 Hyperlipidemia, unspecified: Secondary | ICD-10-CM

## 2021-08-28 DIAGNOSIS — Z1329 Encounter for screening for other suspected endocrine disorder: Secondary | ICD-10-CM

## 2021-08-28 NOTE — Telephone Encounter (Signed)
Called to schedule Patient a lab appointment 3-7 days before their 12/13 appointment.   Left message to return call.

## 2021-09-23 ENCOUNTER — Encounter: Payer: Self-pay | Admitting: Internal Medicine

## 2021-09-23 ENCOUNTER — Other Ambulatory Visit: Payer: Self-pay

## 2021-09-23 ENCOUNTER — Ambulatory Visit (INDEPENDENT_AMBULATORY_CARE_PROVIDER_SITE_OTHER): Payer: 59 | Admitting: Internal Medicine

## 2021-09-23 VITALS — BP 124/82 | HR 64 | Temp 97.0°F | Ht 64.49 in | Wt 189.6 lb

## 2021-09-23 DIAGNOSIS — N938 Other specified abnormal uterine and vaginal bleeding: Secondary | ICD-10-CM

## 2021-09-23 DIAGNOSIS — L989 Disorder of the skin and subcutaneous tissue, unspecified: Secondary | ICD-10-CM

## 2021-09-23 DIAGNOSIS — Z0000 Encounter for general adult medical examination without abnormal findings: Secondary | ICD-10-CM | POA: Diagnosis not present

## 2021-09-23 DIAGNOSIS — K219 Gastro-esophageal reflux disease without esophagitis: Secondary | ICD-10-CM

## 2021-09-23 DIAGNOSIS — I341 Nonrheumatic mitral (valve) prolapse: Secondary | ICD-10-CM

## 2021-09-23 DIAGNOSIS — F419 Anxiety disorder, unspecified: Secondary | ICD-10-CM | POA: Diagnosis not present

## 2021-09-23 DIAGNOSIS — Z1231 Encounter for screening mammogram for malignant neoplasm of breast: Secondary | ICD-10-CM | POA: Diagnosis not present

## 2021-09-23 DIAGNOSIS — F32A Depression, unspecified: Secondary | ICD-10-CM

## 2021-09-23 DIAGNOSIS — Z1283 Encounter for screening for malignant neoplasm of skin: Secondary | ICD-10-CM

## 2021-09-23 MED ORDER — METOPROLOL SUCCINATE ER 50 MG PO TB24: 50.0000 mg | ORAL_TABLET | Freq: Every day | ORAL | 3 refills | Status: DC

## 2021-09-23 MED ORDER — DESVENLAFAXINE SUCCINATE ER 100 MG PO TB24
100.0000 mg | ORAL_TABLET | Freq: Every day | ORAL | 3 refills | Status: DC
Start: 2021-09-23 — End: 2022-05-01

## 2021-09-23 MED ORDER — OMEPRAZOLE 20 MG PO CPDR
20.0000 mg | DELAYED_RELEASE_CAPSULE | Freq: Every day | ORAL | 3 refills | Status: DC
Start: 2021-09-23 — End: 2022-05-01

## 2021-09-23 MED ORDER — LORAZEPAM 1 MG PO TABS: 1.0000 mg | ORAL_TABLET | Freq: Two times a day (BID) | ORAL | 1 refills | Status: DC | PRN

## 2021-09-23 MED ORDER — BUPROPION HCL ER (XL) 150 MG PO TB24: 450.0000 mg | ORAL_TABLET | Freq: Every day | ORAL | 3 refills | Status: DC

## 2021-09-23 MED ORDER — LORAZEPAM 1 MG PO TABS: 1.0000 mg | ORAL_TABLET | Freq: Two times a day (BID) | ORAL | 5 refills | Status: DC | PRN

## 2021-09-23 NOTE — Progress Notes (Signed)
Chief Complaint  Patient presents with   Annual Exam   Annual  1. Anxiety taking ativan 1 mg bid prn and on wellbutrin 450 mg qd and pristiq 100 mg qd  2. Dub spotting cycle Q3 weeks and last time bleed oct 2022 she not longer wants to see Dr. Kennon Rounds this has been going on x 2 years    Review of Systems  Constitutional:  Negative for weight loss.  HENT:  Negative for hearing loss.   Eyes:  Negative for blurred vision.  Respiratory:  Negative for shortness of breath.   Cardiovascular:  Negative for chest pain.  Gastrointestinal:  Negative for abdominal pain and blood in stool.  Musculoskeletal:  Negative for back pain.  Skin:  Negative for rash.  Neurological:  Negative for headaches.  Psychiatric/Behavioral:  Negative for depression.   Past Medical History:  Diagnosis Date   Allergy    Anxiety and depression 08/27/2015   Breast disorder    Ejection murmur 08/27/2015   GERD (gastroesophageal reflux disease)    Hx of migraines    Prolapsing mitral leaflet syndrome 08/27/2015   Sleep apnea 08/27/2015   Urine incontinence    Past Surgical History:  Procedure Laterality Date   ACHILLES TENDON SURGERY Right 08/30/2020   ANKLE FRACTURE SURGERY Right 09/12/2019   COLONOSCOPY WITH PROPOFOL N/A 05/30/2019   Procedure: COLONOSCOPY WITH PROPOFOL;  Surgeon: Lollie Sails, MD;  Location: [facility] ENDOSCOPY;  Service: Endoscopy;  Laterality: N/A;   FLEXIBLE SIGMOIDOSCOPY N/A 05/09/2021   Procedure: FLEXIBLE SIGMOIDOSCOPY;  Surgeon: Lesly Rubenstein, MD;  Location: [facility] ENDOSCOPY;  Service: Endoscopy;  Laterality: N/A;   TUBAL LIGATION     Family History  Problem Relation Age of Onset   Depression Mother    Mental illness Mother    Depression Maternal Grandmother    Arthritis Maternal Grandmother    Heart disease Maternal Grandmother    Hyperlipidemia Maternal Grandmother    Mental illness Maternal Grandmother    Depression Paternal Grandmother    Stroke Paternal Grandmother     Heart disease Paternal Grandmother    Alcohol abuse Father    Cancer Father        lung smoker   Early death Father    Heart disease Father    Depression Daughter    Crohn's disease Daughter    Cancer Paternal Aunt        lung    Cancer Paternal Uncle        ? type   Birth defects Maternal Grandfather    COPD Maternal Grandfather    Breast cancer Neg Hx    Social History   Socioeconomic History   Marital status: Married    Spouse name: Not on file   Number of children: Not on file   Years of education: Not on file   Highest education level: Not on file  Occupational History   Not on file  Tobacco Use   Smoking status: Never   Smokeless tobacco: Never   Tobacco comments:    [place] light smoker   Vaping Use   Vaping Use: Never used  Substance and Sexual Activity   Alcohol use: Yes    Alcohol/week: 0.0 standard drinks   Drug use: No   Sexual activity: Yes    Birth control/protection: Surgical  Other Topics Concern   Not on file  Social History Narrative   Married    2 kids    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   [DISCONTINUED] buPROPion (WELLBUTRIN XL) 150 MG 24 hr tablet TAKE 3 TABLETS BY MOUTH DAILY.   [DISCONTINUED] desvenlafaxine (PRISTIQ) 100 MG 24 hr tablet TAKE 1 TABLET BY MOUTH EVERY DAY   [DISCONTINUED] LORazepam (ATIVAN) 1 MG tablet TAKE 1 TABLET BY MOUTH 2 TIMES DAILY AS NEEDED FOR ANXIETY.   [DISCONTINUED] metoprolol succinate (TOPROL-XL) 50 MG 24 hr tablet Take 1 tablet (50 mg total) by  mouth daily. NEEDS APPOINTMENT FOR FURTHER REFILLS   [DISCONTINUED] omeprazole (PRILOSEC) 20 MG capsule Take 1 capsule (20 mg total) by mouth daily. 30 minutes before food. NEEDS APPOINTMENT FOR FURTHER REFILLS   No Known Allergies No results found for this or any previous visit (from the past 2160 hour(s)). Objective  Body mass index is 32.05 kg/m. Wt Readings from Last 3 Encounters:  09/23/21 189 lb 9.6 oz (86 kg)  05/09/21 195 lb (88.5 kg)  06/22/19 199 lb 12.8 oz (90.6 kg)   Temp Readings from Last 3 Encounters:  09/23/21 (!) 97 F (36.1 C) (Temporal)  05/09/21 98.2 F (36.8 C) (Temporal)  05/30/19 98.7 F (37.1 C) (Oral)   BP Readings from Last 3 Encounters:  09/23/21 124/82  05/09/21 93/78  06/22/19 126/76   Pulse Readings from Last 3 Encounters:  09/23/21 64  05/09/21 (!) 58  06/22/19 75    Physical Exam Vitals and nursing note reviewed.  Constitutional:      Appearance: Normal appearance. She is well-developed and well-groomed.  HENT:     Head: Normocephalic and atraumatic.  Eyes:     Conjunctiva/sclera: Conjunctivae normal.     Pupils: Pupils are equal, round, and reactive to light.  Cardiovascular:     Rate and Rhythm: Normal rate and regular rhythm.     Heart sounds: Normal heart sounds. No murmur heard. Pulmonary:     Effort: Pulmonary effort is normal.     Breath sounds: Normal breath sounds.  Abdominal:     General: Abdomen is flat. Bowel sounds are normal.     Tenderness: There is no abdominal tenderness.  Musculoskeletal:        General: No tenderness.  Skin:    General: Skin is warm and dry.  Neurological:     General: No focal deficit present.     Mental Status: She is alert and oriented to person, place, and time. Mental status is at baseline.     Cranial Nerves: Cranial nerves 2-12 are intact.     Gait: Gait is intact.  Psychiatric:        Attention and Perception: Attention and perception normal.        Mood and Affect: Mood and affect  normal.        Speech: Speech normal.        Behavior: Behavior normal. Behavior is cooperative.        Thought Content: Thought content normal.        Cognition and Memory: Cognition and memory normal.  Judgment: Judgment normal.    Assessment  Plan  Annual physical exam See below   Anxiety - Plan: LORazepam (ATIVAN) 1 MG tablet bid prn  Skin cancer screening - Plan: Ambulatory referral to Dermatology  Skin lesion - Plan: Ambulatory referral to Dermatology  DUB (dysfunctional uterine bleeding)  Prolapsing mitral leaflet syndrome - Plan: metoprolol succinate (TOPROL-XL) 50 MG 24 hr tablet  Gastroesophageal reflux disease - Plan: omeprazole (PRILOSEC) 20 MG capsule  Anxiety and depression - Plan: buPROPion (WELLBUTRIN XL) 450 MG 24 hr tablet, desvenlafaxine (PRISTIQ) 100 MG 24 hr tablet   flu shot had utd  Tdap 10/2014 check [facility] not in [facility] per pt had in 2016 due in 2026 2/2 covid moderna declines further  Consider MMR vaccine and hep B   Pap at referred ob/gyn Dr. Darron Doom for abnormal cycles and check [facility] and TVUS/pelvic US  As of 09/23/21 wants to see another ob/gyn will let me know in future for referral  mammo 09/2021 due   Colonoscopy  [facility] 05/30/2019 due in 5 years per pt    skin-[place] [facility]    Former PCP Dr. Lavera Guise  Eye Dr. Matilde Sprang  Ob/gyn> Provider: Dr. Olivia Mackie McLean-Scocuzza-Internal Medicine

## 2021-09-23 NOTE — Patient Instructions (Addendum)
Consider another ob/gyn Dr. Smitty Knudsen [facility] ob/gyn    [address] [phone]  Dermatology   Abnormal Uterine Bleeding Abnormal uterine bleeding is unusual bleeding from the uterus. It includes bleeding after sex, or bleeding or spotting between menstrual periods. It may also include bleeding that is heavier than normal, menstrual periods that last longer than usual, or bleeding that occurs after menopause. Abnormal uterine bleeding can affect teenagers, women in their reproductive years, pregnant women, and women who have reached menopause. Common causes of abnormal uterine bleeding include: Pregnancy. Abnormal growths within the lining of the uterus (polyps). Benign tumors or growths in the uterus (fibroids). These are not cancer. Infection. Cancer. Too much or too little of some hormones in the body (hormonal imbalances). Any type of abnormal bleeding should be checked by a health care provider. Many cases are minor and simple to treat, but others may be more serious. Treatment will depend on the cause of the bleeding and how severe it is. Follow these instructions at home: Medicines Take over-the-counter and prescription medicines only as told by your health care provider. Ask your health care provider about: Taking medicines such as aspirin and ibuprofen. These medicines can thin your blood. Do not take these medicines unless your health care provider tells you to take them. Taking over-the-counter medicines, vitamins, herbs, and supplements. If you were prescribed iron pills, take them as told by your health care provider. Iron pills help to replace iron that your body loses because of this condition. Managing constipation In cases of severe bleeding, you may be asked to increase your iron intake to treat anemia. Doing this may cause constipation. To prevent or treat constipation, you may need to: Drink enough fluid to keep your urine pale  yellow. Take over-the-counter or prescription medicines. Eat foods that are high in fiber, such as beans, whole grains, and fresh fruits and vegetables. Limit foods that are high in fat and processed sugars, such as fried or sweet foods. Activity Alter your activity to decrease bleeding if you need to change your sanitary pad more than one time every 2 hours: Lie in bed with your feet raised (elevated). Place a cold pack on your lower abdomen. Rest as much as possible until the bleeding stops or slows down. General instructions Do not use tampons, douche, or have sex until your health care provider says these things are okay. Change your sanitary pads often. Get regular exams. These include pelvic exams and cervical cancer screenings. It is up to you to get the results of any tests that are done. Ask your health care provider, or the department that is doing the tests, when your results will be ready. Monitor your condition for any changes. For 2 months, write down: When your menstrual period starts. When your menstrual period ends. When any abnormal vaginal bleeding occurs. What problems you notice. Keep all follow-up visits. This is important. Contact a health care provider if: You have bleeding that lasts for more than one week. You feel dizzy at times. You feel nauseous or you vomit. You feel light-headed or weak. You notice any other changes that show that your condition is getting worse. Get help right away if: You faint. You have bleeding that soaks through a sanitary pad every hour. You have pain in the abdomen. You have a fever or chills. You become sweaty or weak. You pass large blood clots from your vagina. These symptoms may represent a serious problem  that is an emergency. Do not wait to see if the symptoms will go away. Get medical help right away. Call your local emergency services (911 in the [place] Do not drive yourself to the hospital. Summary Abnormal uterine  bleeding is unusual bleeding from the uterus. Any type of abnormal bleeding should be checked by a health care provider. Many cases are minor and simple to treat, but others may be more serious. Treatment will depend on the cause of the bleeding and how severe it is. Get help right away if you faint, you have bleeding that soaks through a sanitary pad every hour, or you pass large blood clots from your vagina. This information is not intended to replace advice given to you by your health care provider. Make sure you discuss any questions you have with your health care provider. Document Revised: 01/28/2021 Document Reviewed: 01/28/2021 Elsevier Patient Education  [place].

## 2021-10-17 ENCOUNTER — Other Ambulatory Visit: Payer: Self-pay | Admitting: Internal Medicine

## 2021-10-17 DIAGNOSIS — F32A Depression, unspecified: Secondary | ICD-10-CM

## 2021-10-17 MED ORDER — BUPROPION HCL ER (XL) 450 MG PO TB24: 450.0000 mg | ORAL_TABLET | Freq: Every day | ORAL | 3 refills | Status: DC

## 2021-10-17 NOTE — Telephone Encounter (Signed)
Pharmacy says alternative requested for Bupropion insurance will not cover 3 tablets per day.

## 2021-10-28 ENCOUNTER — Other Ambulatory Visit (INDEPENDENT_AMBULATORY_CARE_PROVIDER_SITE_OTHER): Payer: 59

## 2021-10-28 ENCOUNTER — Other Ambulatory Visit: Payer: Self-pay

## 2021-10-28 DIAGNOSIS — Z0000 Encounter for general adult medical examination without abnormal findings: Secondary | ICD-10-CM | POA: Diagnosis not present

## 2021-10-28 DIAGNOSIS — E785 Hyperlipidemia, unspecified: Secondary | ICD-10-CM

## 2021-10-28 DIAGNOSIS — E559 Vitamin D deficiency, unspecified: Secondary | ICD-10-CM

## 2021-10-28 DIAGNOSIS — Z1389 Encounter for screening for other disorder: Secondary | ICD-10-CM

## 2021-10-28 DIAGNOSIS — Z1329 Encounter for screening for other suspected endocrine disorder: Secondary | ICD-10-CM | POA: Diagnosis not present

## 2021-10-28 LAB — CBC WITH DIFFERENTIAL/PLATELET
Basophils Absolute: 0 10*3/uL (ref 0.0–0.1)
Basophils Relative: 0.6 % (ref 0.0–3.0)
Eosinophils Absolute: 0.2 10*3/uL (ref 0.0–0.7)
Eosinophils Relative: 2.5 % (ref 0.0–5.0)
HCT: 39.9 % (ref 36.0–46.0)
Hemoglobin: 13.3 g/dL (ref 12.0–15.0)
Lymphocytes Relative: 26.1 % (ref 12.0–46.0)
Lymphs Abs: 1.6 10*3/uL (ref 0.7–4.0)
MCHC: 33.4 g/dL (ref 30.0–36.0)
MCV: 95.4 fl (ref 78.0–100.0)
Monocytes Absolute: 0.4 10*3/uL (ref 0.1–1.0)
Monocytes Relative: 6.2 % (ref 3.0–12.0)
Neutro Abs: 4 10*3/uL (ref 1.4–7.7)
Neutrophils Relative %: 64.6 % (ref 43.0–77.0)
Platelets: 261 10*3/uL (ref 150.0–400.0)
RBC: 4.18 Mil/uL (ref 3.87–5.11)
RDW: 13.6 % (ref 11.5–15.5)
WBC: 6.2 10*3/uL (ref 4.0–10.5)

## 2021-10-28 LAB — COMPREHENSIVE METABOLIC PANEL
ALT: 17 U/L (ref 0–35)
AST: 13 U/L (ref 0–37)
Albumin: 4.1 g/dL (ref 3.5–5.2)
Alkaline Phosphatase: 87 U/L (ref 39–117)
BUN: 11 mg/dL (ref 6–23)
CO2: 29 mEq/L (ref 19–32)
Calcium: 8.6 mg/dL (ref 8.4–10.5)
Chloride: 104 mEq/L (ref 96–112)
Creatinine, Ser: 0.98 mg/dL (ref 0.40–1.20)
GFR: 67.65 mL/min (ref >60.00)
Glucose, Bld: 99 mg/dL (ref 70–99)
Potassium: 3.7 mEq/L (ref 3.5–5.1)
Sodium: 141 mEq/L (ref 135–145)
Total Bilirubin: 0.3 mg/dL (ref 0.2–1.2)
Total Protein: 6.4 g/dL (ref 6.0–8.3)

## 2021-10-28 LAB — LIPID PANEL
Cholesterol: 220 mg/dL — ABNORMAL HIGH (ref 0–200)
HDL: 41.4 mg/dL (ref >39.00)
LDL Cholesterol: 151 mg/dL — ABNORMAL HIGH (ref 0–99)
NonHDL: 178.45
Total CHOL/HDL Ratio: 5
Triglycerides: 136 mg/dL (ref 0.0–149.0)
VLDL: 27.2 mg/dL (ref 0.0–40.0)

## 2021-10-28 LAB — HEMOGLOBIN A1C: Hgb A1c MFr Bld: 5.1 % (ref 4.6–6.5)

## 2021-10-28 LAB — VITAMIN D 25 HYDROXY (VIT D DEFICIENCY, FRACTURES): VITD: 27.9 ng/mL — ABNORMAL LOW (ref 30.00–100.00)

## 2021-10-28 LAB — TSH: TSH: 4.88 u[IU]/mL (ref 0.35–5.50)

## 2021-10-29 ENCOUNTER — Other Ambulatory Visit: Payer: Self-pay | Admitting: Internal Medicine

## 2021-10-29 ENCOUNTER — Encounter: Payer: Self-pay | Admitting: Internal Medicine

## 2021-10-29 DIAGNOSIS — E785 Hyperlipidemia, unspecified: Secondary | ICD-10-CM

## 2021-10-29 LAB — URINALYSIS, ROUTINE W REFLEX MICROSCOPIC
Bilirubin Urine: NEGATIVE
Glucose, UA: NEGATIVE
Hyaline Cast: NONE SEEN /LPF
Ketones, ur: NEGATIVE
Leukocytes,Ua: NEGATIVE
Nitrite: NEGATIVE
Specific Gravity, Urine: 1.021 (ref 1.001–1.035)
pH: 5.5 (ref 5.0–8.0)

## 2021-10-29 LAB — MICROSCOPIC MESSAGE

## 2021-10-29 MED ORDER — PRAVASTATIN SODIUM 20 MG PO TABS: 20.0000 mg | ORAL_TABLET | Freq: Every day | ORAL | 3 refills | Status: DC

## 2021-10-31 NOTE — Telephone Encounter (Signed)
-----   Message from Delorise Jackson, MD sent at 10/30/2021 11:08 AM EST ----- Order and schedule urine culture   Vitamin D low rec D3 4000 Iu daily over the counter  Cholesterol elevated  -does she want to try low dose statin  A1c no prediabetes Liver kidneys normal  Blood cts normal  Thyroid lab normal  Urine with blood was on cycle  -order urine culture or try to add on please   Thank you

## 2021-12-23 ENCOUNTER — Ambulatory Visit
Admission: RE | Admit: 2021-12-23 | Discharge: 2021-12-23 | Disposition: A | Discharge status: Home / Self Care | Payer: 59 | Source: Ambulatory Visit | Attending: Internal Medicine | Admitting: Internal Medicine

## 2021-12-23 ENCOUNTER — Other Ambulatory Visit: Payer: Self-pay

## 2021-12-23 DIAGNOSIS — Z1231 Encounter for screening mammogram for malignant neoplasm of breast: Secondary | ICD-10-CM | POA: Insufficient documentation

## 2022-03-23 ENCOUNTER — Other Ambulatory Visit: Payer: Self-pay | Admitting: Internal Medicine

## 2022-03-23 DIAGNOSIS — F419 Anxiety disorder, unspecified: Secondary | ICD-10-CM

## 2022-04-30 ENCOUNTER — Ambulatory Visit (INDEPENDENT_AMBULATORY_CARE_PROVIDER_SITE_OTHER): Payer: 59 | Admitting: Internal Medicine

## 2022-04-30 ENCOUNTER — Encounter: Payer: Self-pay | Admitting: Internal Medicine

## 2022-04-30 VITALS — Temp 97.2°F | Ht 64.49 in | Wt 186.4 lb

## 2022-04-30 DIAGNOSIS — R519 Headache, unspecified: Secondary | ICD-10-CM | POA: Diagnosis not present

## 2022-04-30 DIAGNOSIS — R42 Dizziness and giddiness: Secondary | ICD-10-CM

## 2022-04-30 DIAGNOSIS — I341 Nonrheumatic mitral (valve) prolapse: Secondary | ICD-10-CM

## 2022-04-30 DIAGNOSIS — K219 Gastro-esophageal reflux disease without esophagitis: Secondary | ICD-10-CM

## 2022-04-30 DIAGNOSIS — G4700 Insomnia, unspecified: Secondary | ICD-10-CM

## 2022-04-30 DIAGNOSIS — H43393 Other vitreous opacities, bilateral: Secondary | ICD-10-CM

## 2022-04-30 DIAGNOSIS — G43919 Migraine, unspecified, intractable, without status migrainosus: Secondary | ICD-10-CM

## 2022-04-30 DIAGNOSIS — J0110 Acute frontal sinusitis, unspecified: Secondary | ICD-10-CM | POA: Diagnosis not present

## 2022-04-30 DIAGNOSIS — Z1231 Encounter for screening mammogram for malignant neoplasm of breast: Secondary | ICD-10-CM

## 2022-04-30 DIAGNOSIS — F32A Depression, unspecified: Secondary | ICD-10-CM

## 2022-04-30 DIAGNOSIS — R319 Hematuria, unspecified: Secondary | ICD-10-CM

## 2022-04-30 DIAGNOSIS — E785 Hyperlipidemia, unspecified: Secondary | ICD-10-CM

## 2022-04-30 DIAGNOSIS — F419 Anxiety disorder, unspecified: Secondary | ICD-10-CM

## 2022-04-30 MED ORDER — AZITHROMYCIN 250 MG PO TABS: ORAL_TABLET | ORAL | 0 refills | Status: DC

## 2022-04-30 MED ORDER — SUMATRIPTAN SUCCINATE 50 MG PO TABS: 50.0000 mg | ORAL_TABLET | Freq: Every day | ORAL | 11 refills | Status: DC | PRN

## 2022-04-30 MED ORDER — DOXYCYCLINE HYCLATE 100 MG PO TABS: 100.0000 mg | ORAL_TABLET | Freq: Two times a day (BID) | ORAL | 0 refills | Status: DC

## 2022-04-30 NOTE — Patient Instructions (Addendum)
HINT  Drink 55-64 ounces of water daily   Melatonin 10 mg at night nature made   If seeing  floaters see the eye doctor Dr. Jacinto Reap nice   Dizziness Dizziness is a common problem. It is a feeling of unsteadiness or light-headedness. You may feel like you are about to faint. Dizziness can lead to injury if you stumble or fall. Anyone can become dizzy, but dizziness is more common in older adults. This condition can be caused by a number of things, including medicines, dehydration, or illness. Follow these instructions at home: Eating and drinking  Drink enough fluid to keep your urine pale yellow. This helps to keep you from becoming dehydrated. Try to drink more clear fluids, such as water. Do not drink alcohol. Limit your caffeine intake if told to do so by your health care provider. Check ingredients and nutrition facts to see if a food or beverage contains caffeine. Limit your salt (sodium) intake if told to do so by your health care provider. Check ingredients and nutrition facts to see if a food or beverage contains sodium. Activity  Avoid making quick movements. Rise slowly from chairs and steady yourself until you feel okay. In the morning, first sit up on the side of the bed. When you feel okay, stand slowly while you hold onto something until you know that your balance is good. If you need to stand in one place for a long time, move your legs often. Tighten and relax the muscles in your legs while you are standing. Do not drive or use machinery if you feel dizzy. Avoid bending down if you feel dizzy. Place items in your home so that they are easy for you to reach without leaning over. Lifestyle Do not use any products that contain nicotine or tobacco. These products include cigarettes, chewing tobacco, and vaping devices, such as e-cigarettes. If you need help quitting, ask your health care provider. Try to reduce your stress level by using methods such as yoga or meditation. Talk with  your health care provider if you need help to manage your stress. General instructions Watch your dizziness for any changes. Take over-the-counter and prescription medicines only as told by your health care provider. Talk with your health care provider if you think that your dizziness is caused by a medicine that you are taking. Tell a friend or a family member that you are feeling dizzy. If he or she notices any changes in your behavior, have this person call your health care provider. Keep all follow-up visits. This is important. Contact a health care provider if: Your dizziness does not go away or you have new symptoms. Your dizziness or light-headedness gets worse. You feel nauseous. You have reduced hearing. You have a fever. You have neck pain or a stiff neck. Your dizziness leads to an injury or a fall. Get help right away if: You vomit or have diarrhea and are unable to eat or drink anything. You have problems talking, walking, swallowing, or using your arms, hands, or legs. You feel generally weak. You have any bleeding. You are not thinking clearly or you have trouble forming sentences. It may take a friend or family member to notice this. You have chest pain, abdominal pain, shortness of breath, or sweating. Your vision changes or you develop a severe headache. These symptoms may represent a serious problem that is an emergency. Do not wait to see if the symptoms will go away. Get medical help right away. Call your  local emergency services (911 in the [place] Do not drive yourself to the hospital. Summary Dizziness is a feeling of unsteadiness or light-headedness. This condition can be caused by a number of things, including medicines, dehydration, or illness. Anyone can become dizzy, but dizziness is more common in older adults. Drink enough fluid to keep your urine pale yellow. Do not drink alcohol. Avoid making quick movements if you feel dizzy. Monitor your dizziness for any  changes. This information is not intended to replace advice given to you by your health care provider. Make sure you discuss any questions you have with your health care provider. Document Revised: 09/02/2020 Document Reviewed: 09/02/2020 Elsevier Patient Education  [place].

## 2022-04-30 NOTE — Progress Notes (Signed)
Chief Complaint  Patient presents with   Dizziness    Pt here for lightheadedness started last Wednesday for 1 day. She was hot and left work early.   F/u  1. 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 Stress and increased migraines h/o taking imitrex 50 and wants refill also c/o frontal sinus pain and pressure new nothing tried and floaters will refer to Dr. Matilde Sprang   Review of Systems  Constitutional:  Negative for weight loss.  HENT:  Negative for hearing loss.   Eyes:  Negative for blurred vision.       +floaters  Respiratory:  Negative for shortness of breath.   Cardiovascular:  Negative for chest pain.  Gastrointestinal:  Negative for abdominal pain and blood in stool.  Genitourinary:  Negative for dysuria.  Musculoskeletal:  Negative for falls and joint pain.  Skin:  Negative for rash.  Neurological:  Positive for dizziness and headaches.  Psychiatric/Behavioral:  Negative for depression.    Past Medical History:  Diagnosis Date   Allergy    Anxiety and depression 08/27/2015   Breast disorder    Ejection murmur 08/27/2015   GERD (gastroesophageal reflux disease)    Hx of migraines    Prolapsing mitral leaflet syndrome 08/27/2015   Sleep apnea 08/27/2015   Urine incontinence    Past Surgical History:  Procedure Laterality Date   ACHILLES TENDON SURGERY Right 08/30/2020   ANKLE FRACTURE SURGERY Right 09/12/2019   COLONOSCOPY WITH PROPOFOL N/A 05/30/2019   Procedure: COLONOSCOPY WITH PROPOFOL;  Surgeon: Lollie Sails, MD;  Location[facility] ENDOSCOPY;  Service: Endoscopy;  Laterality: N/A;   FLEXIBLE SIGMOIDOSCOPY N/A 05/09/2021    Procedure: FLEXIBLE SIGMOIDOSCOPY;  Surgeon: Lesly Rubenstein, MD;  Location: [facility] ENDOSCOPY;  Service: Endoscopy;  Laterality: N/A;   TUBAL LIGATION     Family History  Problem Relation Age of Onset   Depression Mother    Mental illness Mother    Depression Maternal Grandmother    Arthritis Maternal Grandmother    Heart disease Maternal Grandmother    Hyperlipidemia Maternal Grandmother    Mental illness Maternal Grandmother    Depression Paternal Grandmother    Stroke Paternal Grandmother    Heart disease Paternal Grandmother    Alcohol abuse Father    Cancer Father        lung smoker   Early death Father    Heart disease Father    Depression Daughter    Crohn's disease Daughter    Cancer Paternal Aunt        lung    Cancer Paternal Uncle        ? type   Birth defects Maternal Grandfather    COPD Maternal Grandfather    Breast cancer Neg Hx    Social History   Socioeconomic History   Marital status: Married    Spouse name: Not on file   Number of children: Not on file   Years of education: Not on file   Highest education level: Not on file  Occupational History   Not on file  Tobacco Use   Smoking status: Never   Smokeless tobacco:  Never   Tobacco comments:    tee[place] light smoker   Vaping Use   Vaping Use: Never used  Substance and Sexual Activity   Alcohol use: Yes    Alcohol/week: 0.0 standard drinks of alcohol   Drug use: No   Sexual activity: Yes    Birth control/protection: Surgical  Other Topics Concern   Not on file  Social History Narrative   Married    2 kids    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 With food   SUMAtriptan (IMITREX) 50 MG tablet Take 1 tablet (50 mg total) by mouth daily as needed for migraine. May repeat in 2 hours if headache persists or recurs. Max dose 200 mg/24 hrs   [DISCONTINUED] azithromycin (ZITHROMAX) 250 MG tablet With food Take 2 tablets on day 1, then 1 tablet daily on days 2 through 5   No Known Allergies No results found for this or any previous visit (from the past 2160 hour(s)). Objective  Body mass index is 31.51 kg/m. Wt Readings from Last 3 Encounters:  04/30/22 186 lb 6.4 oz (84.6 kg)  09/23/21 189 lb 9.6 oz (86 kg)  05/09/21 195 lb (88.5 kg)   Temp Readings from Last 3 Encounters:  04/30/22 (!) 97.2 F (36.2 C) (Oral)  09/23/21 (!) 97 F (36.1 C) (Temporal)  05/09/21 98.2 F (36.8 C) (Temporal)   BP Readings from Last 3 Encounters:  09/23/21 124/82  05/09/21 93/78  06/22/19 126/76   Pulse Readings from Last 3 Encounters:  09/23/21 64  05/09/21 (!) 58  06/22/19 75    Physical Exam Vitals and nursing note reviewed.  Constitutional:      Appearance: Normal appearance. She is well-developed and well-groomed.  HENT:     Head: Normocephalic and atraumatic.  Eyes:     Conjunctiva/sclera: Conjunctivae normal.     Pupils: Pupils are equal, round, and reactive to light.  Cardiovascular:     Rate and Rhythm: Normal rate and regular rhythm.     Heart sounds: Normal heart sounds. No murmur heard. Pulmonary:     Effort: Pulmonary effort is normal.     Breath sounds: Normal breath sounds.  Abdominal:     General: Abdomen is flat. Bowel sounds are normal.     Tenderness: There is no abdominal tenderness.  Musculoskeletal:        General: No tenderness.  Skin:    General: Skin is warm and dry.  Neurological:     General: No focal deficit present.     Mental Status: She is alert and oriented to person, place, and time.  Mental status is at baseline.     Cranial Nerves: Cranial nerves 2-12 are intact.     Motor: Motor function is intact.     Coordination: Coordination is intact.     Gait: Gait is intact.  Psychiatric:        Attention and Perception: Attention and perception normal.  Mood and Affect: Mood and affect normal.        Speech: Speech normal.        Behavior: Behavior normal. Behavior is cooperative.        Thought Content: Thought content normal.        Cognition and Memory: Cognition and memory normal.        Judgment: Judgment normal.     Assessment  Plan  Lightheadedness - Plan: ECHOCARDIOGRAM COMPLETE Orthostatics negative  MVP (mitral valve prolapse) - Plan: ECHOCARDIOGRAM COMPLETE   Hematuria, unspecified type - Plan: Urine Culture  Acute non-recurrent frontal sinusitis - Plan: doxycycline (VIBRA-TABS) 100 MG tablet, DISCONTINUED: azithromycin (ZITHROMAX) 250 MG tablet zpack does not work for her   Intractable migraine without status migrainosus, unspecified migraine type - Plan: SUMAtriptan (IMITREX) 50 MG tablet Intractable headache could be tension/stress related  Prn imitrex  Insomnia, unspecified type Pt will try melatonin 10 mg qhs   Vitreous floaters of both eyes - Plan: Ambulatory referral to Optometry Dr. Matilde Sprang   HM flu shot had utd  Tdap 10/2014 check [facility] not in [place] per pt had in 2016 due in 2026 2/2 covid moderna declines further  Consider MMR vaccine and hep B    Pap at referred ob/gyn Dr. Darron Doom for abnormal cycles and check [facility] and TVUS/pelvic US  As of 09/23/21 wants to see another ob/gyn will let me know in future for referral   mammo 12/23/21 neg  ordered 2024    Colonoscopy  [facility] 05/30/2019 due in 5 years per pt    skin-[place] [facility]   Rec healthy diet and exercise   Former PCP Dr. Lavera Guise  Eye Dr. Matilde Sprang  Ob/gyn>Dr. Darron Doom   Provider: Dr. Olivia Mackie [facility]

## 2022-05-01 ENCOUNTER — Encounter: Payer: Self-pay | Admitting: Internal Medicine

## 2022-05-01 DIAGNOSIS — I341 Nonrheumatic mitral (valve) prolapse: Secondary | ICD-10-CM | POA: Insufficient documentation

## 2022-05-01 LAB — URINE CULTURE
MICRO NUMBER:: 13673148
Result:: NO GROWTH
SPECIMEN QUALITY:: ADEQUATE

## 2022-05-01 MED ORDER — PRAVASTATIN SODIUM 20 MG PO TABS: 20.0000 mg | ORAL_TABLET | Freq: Every day | ORAL | 3 refills | Status: DC

## 2022-05-01 MED ORDER — LORAZEPAM 1 MG PO TABS: 1.0000 mg | ORAL_TABLET | Freq: Two times a day (BID) | ORAL | 5 refills | Status: DC | PRN

## 2022-05-01 MED ORDER — OMEPRAZOLE 20 MG PO CPDR: 20.0000 mg | DELAYED_RELEASE_CAPSULE | Freq: Every day | ORAL | 3 refills | Status: DC

## 2022-05-01 MED ORDER — METOPROLOL SUCCINATE ER 50 MG PO TB24: 50.0000 mg | ORAL_TABLET | Freq: Every day | ORAL | 3 refills | Status: DC

## 2022-05-01 MED ORDER — DESVENLAFAXINE SUCCINATE ER 100 MG PO TB24: 100.0000 mg | ORAL_TABLET | Freq: Every day | ORAL | 3 refills | Status: DC

## 2022-05-05 ENCOUNTER — Telehealth: Payer: Self-pay

## 2022-05-05 NOTE — Telephone Encounter (Signed)
Pt has seen results via mychart on 05/05/2022 8:23 AM Lvm for pt to return call to ask if she want CT ab pelvis ito further work up

## 2022-05-06 ENCOUNTER — Telehealth: Payer: Self-pay | Admitting: Internal Medicine

## 2022-05-06 NOTE — Telephone Encounter (Signed)
Pt returning call and I read the message to her and she will look into finding another eye doctor

## 2022-05-06 NOTE — Telephone Encounter (Signed)
For eye MD referral Dr. Matilde Sprang does not take her insurance  Who does she have?  She will have to see who is in network they are saying she has Friday healthy but we have [place]  Who does she have?

## 2022-06-18 DIAGNOSIS — M2242 Chondromalacia patellae, left knee: Secondary | ICD-10-CM | POA: Diagnosis not present

## 2022-08-21 ENCOUNTER — Telehealth: Payer: Self-pay | Admitting: *Deleted

## 2022-08-21 ENCOUNTER — Telehealth: Payer: Self-pay | Admitting: Internal Medicine

## 2022-08-21 NOTE — Telephone Encounter (Signed)
Patient called and is requesting a refill on her Wellbutrin.

## 2022-08-23 ENCOUNTER — Other Ambulatory Visit: Payer: Self-pay | Admitting: Family

## 2022-08-23 MED ORDER — BUPROPION HCL ER (XL) 150 MG PO TB24: 150.0000 mg | ORAL_TABLET | Freq: Every day | ORAL | 3 refills | Status: DC

## 2022-08-24 NOTE — Telephone Encounter (Signed)
Medication refilled by Dutch Quint, NP

## 2022-08-25 ENCOUNTER — Ambulatory Visit: Payer: 59 | Admitting: Family Medicine

## 2022-08-25 ENCOUNTER — Encounter: Payer: Self-pay | Admitting: Family Medicine

## 2022-08-25 VITALS — BP 136/84 | HR 71 | Temp 97.4°F | Ht 64.49 in | Wt 190.6 lb

## 2022-08-25 DIAGNOSIS — F419 Anxiety disorder, unspecified: Secondary | ICD-10-CM

## 2022-08-25 DIAGNOSIS — Z0000 Encounter for general adult medical examination without abnormal findings: Secondary | ICD-10-CM

## 2022-08-25 DIAGNOSIS — R69 Illness, unspecified: Secondary | ICD-10-CM | POA: Diagnosis not present

## 2022-08-25 DIAGNOSIS — R030 Elevated blood-pressure reading, without diagnosis of hypertension: Secondary | ICD-10-CM | POA: Diagnosis not present

## 2022-08-25 DIAGNOSIS — F32A Depression, unspecified: Secondary | ICD-10-CM

## 2022-08-25 MED ORDER — BUPROPION HCL ER (XL) 150 MG PO TB24: 450.0000 mg | ORAL_TABLET | Freq: Every day | ORAL | 3 refills | Status: DC

## 2022-08-25 NOTE — Patient Instructions (Addendum)
It was a pleasure meeting you today. Thank you for allowing me to take part in your health care.  Our goals for today as we discussed include:  For your blood pressure Continue metoprolol 50 mg daily.   Monitor your blood pressure at home Obtain labs at your annual visit   For your anxiety and depression Continue Wellbutrin 450 mg daily Continue Pristiq 100 mg daily Decrease lorazepam to 1 mg daily as needed Recommend therapy for stress management.   Referral previously sent for Mammogram. Please call to schedule appointment. [facility] [place], [place] [address] [phone]    Please follow-up with PCP as scheduled for annual physical and Pap  Recommend Shingles vaccine.  This is a 2 dose series and can be given at your local pharmacy.  Please talk to your pharmacist about this.   If you have any questions or concerns, please do not hesitate to call the office at [phone].  I look forward to our next visit and until then take care and stay safe.  Regards,   Carollee Leitz, MD   [facility]

## 2022-08-25 NOTE — Progress Notes (Deleted)
err

## 2022-09-02 NOTE — Telephone Encounter (Signed)
Was refilled at [facility] visit with Dr. Volanda Napoleon on 08/25/22

## 2022-09-07 ENCOUNTER — Encounter: Payer: Self-pay | Admitting: Family Medicine

## 2022-09-07 NOTE — Assessment & Plan Note (Signed)
Doing well.  PHQ-9 and GAD scores improving. Continue current medication

## 2022-09-07 NOTE — Assessment & Plan Note (Addendum)
Currently on metoprolol 50 mg daily for history of prolapsing mitral leaf syndrome that was noted by former PCP. Monitor blood pressure at home and will review again at next visit.  If remains elevated can discuss other options for medications at next visit.  Will repeat labs for next visit

## 2022-09-07 NOTE — Progress Notes (Signed)
SUBJECTIVE:   Chief Complaint  Patient presents with   Establish Care    Transfer of Care- Dr. Olivia Mackie   HPI  Transfer care   Patient comes to clinic to transfer care   No acute concerns today.   Hypertension.  Asymptomatic.  Takes metoprolol 50 mg daily and tolerating medication well.   Anxiety/depression Doing well.  Has increased stress at work.  Has been taking Wellbutrin 450 mg daily, Pristiq 100 mg daily, lorazepam 1 mg daily throughout the weekdays. Denies any SI/HI.  Previously seen and psychiatry years ago.  Does need a refill for her Wellbutrin  PERTINENT PMH / PSH: Hypertension Anxiety/depression History of prolapse mitral leaflet syndrome  OBJECTIVE:  BP 136/84 (BP Location: Left Arm, Patient Position: Sitting, Cuff Size: Normal)   Pulse 71   Temp (!) 97.4 F (36.3 C) (Oral)   Ht 5' 4.49" (1.638 m)   Wt 190 lb 9.6 oz (86.5 kg)   LMP 08/18/2022   SpO2 99%   BMI 32.22 kg/m    Physical Exam Vitals reviewed.  Constitutional:      General: She is not in acute distress.    Appearance: She is not ill-appearing.  HENT:     Head: Normocephalic.     Nose: Nose normal.  Eyes:     Conjunctiva/sclera: Conjunctivae normal.  Cardiovascular:     Rate and Rhythm: Normal rate and regular rhythm.     Heart sounds: Normal heart sounds.  Pulmonary:     Effort: Pulmonary effort is normal.     Breath sounds: Normal breath sounds.  Abdominal:     General: Abdomen is flat. Bowel sounds are normal.     Palpations: Abdomen is soft.  Musculoskeletal:        General: Normal range of motion.     Cervical back: Normal range of motion.  Neurological:     Mental Status: She is alert and oriented to person, place, and time. Mental status is at baseline.  Psychiatric:        Mood and Affect: Mood normal.        Behavior: Behavior normal.        Thought Content: Thought content normal.        Judgment: Judgment normal.       08/25/2022   10:24 AM 08/25/2022    9:36  AM 04/30/2022    3:18 PM 09/23/2021    9:37 AM 05/02/2019    2:03 PM  Depression screen PHQ 2/9  Decreased Interest '1 1 1 1 '$ 0  Down, Depressed, Hopeless '1 1 1 1 '$ 0  PHQ - 2 Score '2 2 2 2 '$ 0  Altered sleeping '1  2 1   '$ Tired, decreased energy '1  1 1   '$ Change in appetite '1  2 2   '$ Feeling bad or failure about yourself  0  1 0   Trouble concentrating 0  0 0   Moving slowly or fidgety/restless 0  1 1   Suicidal thoughts 0  0 0   PHQ-9 Score '5  9 7   '$ Difficult doing work/chores Somewhat difficult  Not difficult at all Somewhat difficult        08/25/2022   10:30 AM 09/23/2021   11:33 AM  GAD 7 : Generalized Anxiety Score  Nervous, Anxious, on Edge 1 1  Control/stop worrying 1 1  Worry too much - different things 1 1  Trouble relaxing 0 1  Restless 0 1  Easily annoyed or irritable  1 1  Afraid - awful might happen 0 1  Total GAD 7 Score 4 7  Anxiety Difficulty Somewhat difficult Somewhat difficult      ASSESSMENT/PLAN:  Anxiety and depression Assessment & Plan: Doing well.  PHQ-9 and GAD scores improving. Continue current medication  Orders: -     buPROPion HCl ER (XL); Take 3 tablets (450 mg total) by mouth daily. Patient takes 3 tablets a day  Dispense: 90 tablet; Refill: 3  Annual physical exam -     Comprehensive metabolic panel; Future -     Hemoglobin A1c; Future -     CBC with Differential/Platelet; Future -     Lipid panel; Future -     TSH; Future -     VITAMIN D 25 Hydroxy (Vit-D Deficiency, Fractures); Future -     Vitamin B12; Future  Elevated blood pressure reading Assessment & Plan: Currently on metoprolol 50 mg daily for history of prolapsing mitral leaf syndrome that was noted by former PCP. Monitor blood pressure at home and will review again at next visit.  If remains elevated can discuss other options for medications at next visit.  Will repeat labs for next visit   HCM Last Pap 06/2019, NILM, HPV negative.  Now due.  Patient to schedule  appointment Colonoscopy Mammogram ordered 07/21.  Patient to call breast center to schedule appointment  PDMP reviewed   Return in about 4 weeks (around 09/22/2022) for annual visit with fasting labs 1 week prior.  Carollee Leitz, MD

## 2022-09-16 ENCOUNTER — Encounter: Payer: Self-pay | Admitting: Family Medicine

## 2022-09-17 DIAGNOSIS — J069 Acute upper respiratory infection, unspecified: Secondary | ICD-10-CM | POA: Diagnosis not present

## 2022-09-17 DIAGNOSIS — R051 Acute cough: Secondary | ICD-10-CM | POA: Diagnosis not present

## 2022-09-18 ENCOUNTER — Ambulatory Visit: Admission: RE | Admit: 2022-09-18 | Payer: 59 | Source: Ambulatory Visit

## 2022-09-23 DIAGNOSIS — H66003 Acute suppurative otitis media without spontaneous rupture of ear drum, bilateral: Secondary | ICD-10-CM | POA: Diagnosis not present

## 2022-09-24 ENCOUNTER — Encounter: Payer: 59 | Admitting: Internal Medicine

## 2022-09-24 ENCOUNTER — Encounter: Payer: 59 | Admitting: Family Medicine

## 2022-10-27 ENCOUNTER — Encounter: Payer: 59 | Admitting: Family Medicine

## 2022-10-28 ENCOUNTER — Encounter: Payer: Self-pay | Admitting: Family Medicine

## 2022-10-29 ENCOUNTER — Encounter: Payer: 59 | Admitting: Family Medicine

## 2022-11-16 ENCOUNTER — Other Ambulatory Visit: Payer: Self-pay | Admitting: Family Medicine

## 2022-11-16 DIAGNOSIS — Z1231 Encounter for screening mammogram for malignant neoplasm of breast: Secondary | ICD-10-CM

## 2022-11-25 ENCOUNTER — Ambulatory Visit: Payer: 59 | Admitting: Family Medicine

## 2022-11-25 ENCOUNTER — Encounter: Payer: Self-pay | Admitting: Family Medicine

## 2022-11-25 ENCOUNTER — Other Ambulatory Visit: Payer: Self-pay

## 2022-11-25 VITALS — BP 118/78 | HR 66 | Temp 98.0°F | Ht 64.0 in | Wt 194.0 lb

## 2022-11-25 DIAGNOSIS — Z0000 Encounter for general adult medical examination without abnormal findings: Secondary | ICD-10-CM

## 2022-11-25 DIAGNOSIS — F39 Unspecified mood [affective] disorder: Secondary | ICD-10-CM | POA: Diagnosis not present

## 2022-11-25 DIAGNOSIS — Z1159 Encounter for screening for other viral diseases: Secondary | ICD-10-CM

## 2022-11-25 DIAGNOSIS — Z114 Encounter for screening for human immunodeficiency virus [HIV]: Secondary | ICD-10-CM

## 2022-11-25 DIAGNOSIS — E669 Obesity, unspecified: Secondary | ICD-10-CM

## 2022-11-25 DIAGNOSIS — E538 Deficiency of other specified B group vitamins: Secondary | ICD-10-CM

## 2022-11-25 DIAGNOSIS — E782 Mixed hyperlipidemia: Secondary | ICD-10-CM | POA: Diagnosis not present

## 2022-11-25 DIAGNOSIS — R69 Illness, unspecified: Secondary | ICD-10-CM | POA: Diagnosis not present

## 2022-11-25 DIAGNOSIS — I341 Nonrheumatic mitral (valve) prolapse: Secondary | ICD-10-CM

## 2022-11-25 DIAGNOSIS — F419 Anxiety disorder, unspecified: Secondary | ICD-10-CM

## 2022-11-25 DIAGNOSIS — E559 Vitamin D deficiency, unspecified: Secondary | ICD-10-CM | POA: Diagnosis not present

## 2022-11-25 DIAGNOSIS — Z1329 Encounter for screening for other suspected endocrine disorder: Secondary | ICD-10-CM

## 2022-11-25 LAB — VITAMIN B12: Vitamin B-12: 151 pg/mL — ABNORMAL LOW (ref 211–911)

## 2022-11-25 LAB — COMPREHENSIVE METABOLIC PANEL
ALT: 10 U/L (ref 0–35)
AST: 11 U/L (ref 0–37)
Albumin: 4.1 g/dL (ref 3.5–5.2)
Alkaline Phosphatase: 93 U/L (ref 39–117)
BUN: 12 mg/dL (ref 6–23)
CO2: 29 mEq/L (ref 19–32)
Calcium: 9 mg/dL (ref 8.4–10.5)
Chloride: 104 mEq/L (ref 96–112)
Creatinine, Ser: 0.94 mg/dL (ref 0.40–1.20)
GFR: 70.59 mL/min (ref >60.00)
Glucose, Bld: 90 mg/dL (ref 70–99)
Potassium: 4.1 mEq/L (ref 3.5–5.1)
Sodium: 139 mEq/L (ref 135–145)
Total Bilirubin: 0.6 mg/dL (ref 0.2–1.2)
Total Protein: 6.5 g/dL (ref 6.0–8.3)

## 2022-11-25 LAB — CBC WITH DIFFERENTIAL/PLATELET
Basophils Absolute: 0.1 10*3/uL (ref 0.0–0.1)
Basophils Relative: 0.9 % (ref 0.0–3.0)
Eosinophils Absolute: 0.2 10*3/uL (ref 0.0–0.7)
Eosinophils Relative: 2.9 % (ref 0.0–5.0)
HCT: 39.2 % (ref 36.0–46.0)
Hemoglobin: 13.4 g/dL (ref 12.0–15.0)
Lymphocytes Relative: 21.6 % (ref 12.0–46.0)
Lymphs Abs: 1.4 10*3/uL (ref 0.7–4.0)
MCHC: 34.1 g/dL (ref 30.0–36.0)
MCV: 95.8 fl (ref 78.0–100.0)
Monocytes Absolute: 0.5 10*3/uL (ref 0.1–1.0)
Monocytes Relative: 7.2 % (ref 3.0–12.0)
Neutro Abs: 4.4 10*3/uL (ref 1.4–7.7)
Neutrophils Relative %: 67.4 % (ref 43.0–77.0)
Platelets: 275 10*3/uL (ref 150.0–400.0)
RBC: 4.09 Mil/uL (ref 3.87–5.11)
RDW: 13.3 % (ref 11.5–15.5)
WBC: 6.6 10*3/uL (ref 4.0–10.5)

## 2022-11-25 LAB — LIPID PANEL
Cholesterol: 199 mg/dL (ref 0–200)
HDL: 44.5 mg/dL (ref >39.00)
LDL Cholesterol: 125 mg/dL — ABNORMAL HIGH (ref 0–99)
NonHDL: 154.21
Total CHOL/HDL Ratio: 4
Triglycerides: 144 mg/dL (ref 0.0–149.0)
VLDL: 28.8 mg/dL (ref 0.0–40.0)

## 2022-11-25 LAB — TSH: TSH: 2.55 u[IU]/mL (ref 0.35–5.50)

## 2022-11-25 LAB — HEMOGLOBIN A1C: Hgb A1c MFr Bld: 5.2 % (ref 4.6–6.5)

## 2022-11-25 LAB — VITAMIN D 25 HYDROXY (VIT D DEFICIENCY, FRACTURES): VITD: 31.63 ng/mL (ref 30.00–100.00)

## 2022-11-25 NOTE — Progress Notes (Signed)
SUBJECTIVE:   Chief Complaint  Patient presents with   Annual Exam    Bp medication check   HPI Patient presents to clinic for annual physical and blood pressure checkup.  No acute concerns today.  Recently seen in clinic for elevated blood pressure.  Asymptomatic.  Has not been checking blood pressure at home.  Currently on metoprolol XL 50 mg daily.  Requesting refill.  Mood disorder Doing well.  Requesting refills for Wellbutrin.  Denies any SI/HI.  Also previously prescribed lorazepam 1 mg twice daily as needed and Pristiq 100 mg daily.  No request for refills.   Hyperlipidemia On statin therapy and tolerating well.  Takes Pravachol 20 mg daily    PERTINENT PMH / PSH: Mitral valve leaflet syndrome/MVP Hyperlipidemia Mood disorder  OBJECTIVE:  BP 118/78   Pulse 66   Temp 98 F (36.7 C) (Oral)   Ht 5' 4"$  (1.626 m)   Wt 194 lb (88 kg)   LMP 11/21/2022 (Exact Date)   SpO2 97%   BMI 33.30 kg/m    Physical Exam Vitals reviewed.  Constitutional:      General: She is not in acute distress.    Appearance: Normal appearance. She is normal weight. She is not ill-appearing, toxic-appearing or diaphoretic.  HENT:     Right Ear: Tympanic membrane, ear canal and external ear normal.     Left Ear: Tympanic membrane, ear canal and external ear normal.  Eyes:     General:        Right eye: No discharge.        Left eye: No discharge.     Conjunctiva/sclera: Conjunctivae normal.  Neck:     Thyroid: No thyromegaly or thyroid tenderness.  Cardiovascular:     Rate and Rhythm: Normal rate and regular rhythm.     Heart sounds: Normal heart sounds.  Pulmonary:     Effort: Pulmonary effort is normal.     Breath sounds: Normal breath sounds.  Abdominal:     General: Bowel sounds are normal.  Musculoskeletal:        General: Normal range of motion.  Skin:    General: Skin is warm and dry.  Neurological:     General: No focal deficit present.     Mental Status: She is  alert and oriented to person, place, and time. Mental status is at baseline.  Psychiatric:        Mood and Affect: Mood normal.        Behavior: Behavior normal.        Thought Content: Thought content normal.        Judgment: Judgment normal.     ASSESSMENT/PLAN:  Mood disorder (HCC) Assessment & Plan: Chronic.  Stable.  Denies SI/HI.  Tolerating Wellbutrin, Pristiq and lorazepam.  Previously discussed weaning lorazepam given side effects. Refill Wellbutrin XL 450 mg daily Continue Pristiq 100 mg daily Continue lorazepam 1 mg twice daily as needed, plan to wean in future. Encourage CBT Mental health resources provided    Mixed hyperlipidemia Assessment & Plan: Chronic.  Stable. Currently on statin therapy and tolerating well.  No myalgias Continue Pravachol 20 mg daily  Orders: -     Lipid panel  Vitamin D deficiency Assessment & Plan: Vitamin D level low norm Start vitamin D 1.25 mg weekly x 12 weeks  Orders: -     VITAMIN D 25 Hydroxy (Vit-D Deficiency, Fractures) -     Vitamin D (Ergocalciferol); Take 1 capsule (50,000 Units total)  by mouth every 7 (seven) days.  Dispense: 12 capsule; Refill: 0  Obesity (BMI 30-39.9) Assessment & Plan: Plan to discuss weight management in future Checking A1c, TSH, CBC, B12 today  Orders: -     Hemoglobin A1c -     CBC with Differential/Platelet -     Comprehensive metabolic panel -     Vitamin B12 -     TSH  Screening for HIV without presence of risk factors -     HIV Antibody (routine testing w rflx)  Encounter for hepatitis C screening test for low risk patient -     Hepatitis C antibody  Vitamin B 12 deficiency Assessment & Plan: Vitamin B12 levels low Start vitamin B12 sublingual 1000 mcg daily  Orders: -     Vitamin B-12; Place 1 tablet (1,000 mcg total) under the tongue daily.  Dispense: 90 tablet; Refill: 3  MVP (mitral valve prolapse) Assessment & Plan: Chronic.  Stable.  Continue metoprolol XL 50 mg  daily   HCM Mammogram scheduled Colonoscopy up-to-date Pap 06/2019.  NILM, HPV negative.  Patient to schedule appointment for Pap smear at earliest convenience. Recommend shingles Tetanus up-to-date.  Due 2026 Flu up-to-date COVID up-to-date  PDMP reviewed  Return for annual visit with fasting labs 1 week prior.  Carollee Leitz, MD

## 2022-11-25 NOTE — Patient Instructions (Addendum)
It was a pleasure meeting you today. Thank you for allowing me to take part in your health care.  Our goals for today as we discussed include:  We will get some labs today.  If they are abnormal or we need to do something about them, I will call you.  If they are normal, I will send you a message on MyChart (if it is active) or a letter in the mail.  If you don't hear from [place] in 2 weeks, please call the office at the number below.   Pap due 2023. Please schedule appointment at your earliest convenience  Recommend Shingles vaccine.  This is a 2 dose series and can be given at your local pharmacy.  Please talk to your pharmacist about this.   For [place] [phone] Address: [place], [place] [address] Hours: Open 24/7, No appointment required.    Recommend [email or web address] to look for therapist to help with management of anxiety/depression.  If you have any questions or concerns, please do not hesitate to call the office at [phone].  I look forward to our next visit and until then take care and stay safe.  Regards,   Carollee Leitz, MD   [facility]

## 2022-11-26 LAB — HIV ANTIBODY (ROUTINE TESTING W REFLEX): HIV 1&2 Ab, 4th Generation: NONREACTIVE

## 2022-11-26 LAB — HEPATITIS C ANTIBODY: Hepatitis C Ab: NONREACTIVE

## 2022-11-27 ENCOUNTER — Encounter: Payer: Self-pay | Admitting: Family Medicine

## 2022-11-27 ENCOUNTER — Other Ambulatory Visit: Payer: Self-pay | Admitting: Family Medicine

## 2022-11-27 DIAGNOSIS — Z1159 Encounter for screening for other viral diseases: Secondary | ICD-10-CM | POA: Insufficient documentation

## 2022-11-27 DIAGNOSIS — Z114 Encounter for screening for human immunodeficiency virus [HIV]: Secondary | ICD-10-CM | POA: Insufficient documentation

## 2022-11-27 DIAGNOSIS — E559 Vitamin D deficiency, unspecified: Secondary | ICD-10-CM | POA: Insufficient documentation

## 2022-11-27 DIAGNOSIS — Z1329 Encounter for screening for other suspected endocrine disorder: Secondary | ICD-10-CM | POA: Insufficient documentation

## 2022-11-27 DIAGNOSIS — F39 Unspecified mood [affective] disorder: Secondary | ICD-10-CM | POA: Insufficient documentation

## 2022-11-27 DIAGNOSIS — E669 Obesity, unspecified: Secondary | ICD-10-CM | POA: Insufficient documentation

## 2022-11-27 DIAGNOSIS — E538 Deficiency of other specified B group vitamins: Secondary | ICD-10-CM | POA: Insufficient documentation

## 2022-11-27 DIAGNOSIS — F419 Anxiety disorder, unspecified: Secondary | ICD-10-CM

## 2022-11-27 MED ORDER — LORAZEPAM 1 MG PO TABS: 1.0000 mg | ORAL_TABLET | Freq: Two times a day (BID) | ORAL | 0 refills | Status: DC | PRN

## 2022-11-27 MED ORDER — VITAMIN D (ERGOCALCIFEROL) 1.25 MG (50000 UNIT) PO CAPS: 50000.0000 [IU] | ORAL_CAPSULE | ORAL | 0 refills | Status: DC

## 2022-11-27 MED ORDER — VITAMIN B-12 1000 MCG SL SUBL: 1000.0000 ug | SUBLINGUAL_TABLET | Freq: Every day | SUBLINGUAL | 3 refills | Status: DC

## 2022-11-27 NOTE — Assessment & Plan Note (Signed)
Vitamin D level low norm Start vitamin D 1.25 mg weekly x 12 weeks

## 2022-11-27 NOTE — Assessment & Plan Note (Signed)
Plan to discuss weight management in future Checking A1c, TSH, CBC, B12 today

## 2022-11-27 NOTE — Assessment & Plan Note (Signed)
Vitamin B12 levels low Start vitamin B12 sublingual 1000 mcg daily

## 2022-11-27 NOTE — Assessment & Plan Note (Addendum)
Chronic.  Stable.  Continue metoprolol XL 50 mg daily

## 2022-11-27 NOTE — Assessment & Plan Note (Signed)
Chronic.  Stable.  Denies SI/HI.  Tolerating Wellbutrin, Pristiq and lorazepam.  Previously discussed weaning lorazepam given side effects. Refill Wellbutrin XL 450 mg daily Continue Pristiq 100 mg daily Continue lorazepam 1 mg twice daily as needed, plan to wean in future. Encourage CBT Mental health resources provided

## 2022-11-27 NOTE — Assessment & Plan Note (Signed)
Chronic.  Stable. Currently on statin therapy and tolerating well.  No myalgias Continue Pravachol 20 mg daily

## 2022-12-19 ENCOUNTER — Other Ambulatory Visit: Payer: Self-pay | Admitting: Family Medicine

## 2022-12-19 DIAGNOSIS — F419 Anxiety disorder, unspecified: Secondary | ICD-10-CM

## 2022-12-25 ENCOUNTER — Ambulatory Visit
Admission: RE | Admit: 2022-12-25 | Discharge: 2022-12-25 | Disposition: A | Discharge status: Home / Self Care | Payer: 59 | Source: Ambulatory Visit | Attending: Family Medicine | Admitting: Family Medicine

## 2022-12-25 DIAGNOSIS — Z1231 Encounter for screening mammogram for malignant neoplasm of breast: Secondary | ICD-10-CM | POA: Diagnosis not present

## 2022-12-28 ENCOUNTER — Other Ambulatory Visit: Payer: Self-pay | Admitting: Family Medicine

## 2022-12-28 DIAGNOSIS — R928 Other abnormal and inconclusive findings on diagnostic imaging of breast: Secondary | ICD-10-CM

## 2022-12-29 ENCOUNTER — Other Ambulatory Visit: Payer: Self-pay | Admitting: Family Medicine

## 2022-12-29 DIAGNOSIS — N6489 Other specified disorders of breast: Secondary | ICD-10-CM

## 2022-12-29 DIAGNOSIS — R928 Other abnormal and inconclusive findings on diagnostic imaging of breast: Secondary | ICD-10-CM

## 2022-12-31 ENCOUNTER — Ambulatory Visit
Admission: RE | Admit: 2022-12-31 | Discharge: 2022-12-31 | Disposition: A | Discharge status: Home / Self Care | Payer: 59 | Source: Ambulatory Visit | Attending: Family Medicine | Admitting: Family Medicine

## 2022-12-31 DIAGNOSIS — R928 Other abnormal and inconclusive findings on diagnostic imaging of breast: Secondary | ICD-10-CM

## 2022-12-31 DIAGNOSIS — N6489 Other specified disorders of breast: Secondary | ICD-10-CM | POA: Diagnosis not present

## 2023-01-11 ENCOUNTER — Other Ambulatory Visit: Payer: Self-pay | Admitting: Family Medicine

## 2023-01-11 DIAGNOSIS — F419 Anxiety disorder, unspecified: Secondary | ICD-10-CM

## 2023-01-12 ENCOUNTER — Other Ambulatory Visit: Payer: Self-pay

## 2023-01-12 DIAGNOSIS — F419 Anxiety disorder, unspecified: Secondary | ICD-10-CM

## 2023-01-12 MED ORDER — LORAZEPAM 1 MG PO TABS: 1.0000 mg | ORAL_TABLET | Freq: Every day | ORAL | 0 refills | Status: DC | PRN

## 2023-05-25 ENCOUNTER — Other Ambulatory Visit: Payer: Self-pay | Admitting: Family Medicine

## 2023-05-25 DIAGNOSIS — I341 Nonrheumatic mitral (valve) prolapse: Secondary | ICD-10-CM

## 2023-05-26 ENCOUNTER — Other Ambulatory Visit: Payer: Self-pay

## 2023-05-26 DIAGNOSIS — I341 Nonrheumatic mitral (valve) prolapse: Secondary | ICD-10-CM

## 2023-05-26 MED ORDER — METOPROLOL SUCCINATE ER 50 MG PO TB24: 50.0000 mg | ORAL_TABLET | Freq: Every day | ORAL | 3 refills | Status: DC

## 2023-06-23 ENCOUNTER — Other Ambulatory Visit: Payer: Self-pay | Admitting: Family Medicine

## 2023-06-23 DIAGNOSIS — K219 Gastro-esophageal reflux disease without esophagitis: Secondary | ICD-10-CM

## 2023-06-23 DIAGNOSIS — E785 Hyperlipidemia, unspecified: Secondary | ICD-10-CM

## 2023-06-23 DIAGNOSIS — F32A Depression, unspecified: Secondary | ICD-10-CM

## 2023-07-28 ENCOUNTER — Other Ambulatory Visit: Payer: Self-pay | Admitting: Family Medicine

## 2023-07-28 DIAGNOSIS — F419 Anxiety disorder, unspecified: Secondary | ICD-10-CM

## 2023-09-06 ENCOUNTER — Other Ambulatory Visit: Payer: Self-pay | Admitting: Family Medicine

## 2023-09-06 DIAGNOSIS — F419 Anxiety disorder, unspecified: Secondary | ICD-10-CM

## 2023-10-25 ENCOUNTER — Other Ambulatory Visit: Payer: Self-pay | Admitting: Family Medicine

## 2023-10-25 DIAGNOSIS — F419 Anxiety disorder, unspecified: Secondary | ICD-10-CM

## 2023-12-03 ENCOUNTER — Other Ambulatory Visit: Payer: Self-pay | Admitting: Family Medicine

## 2023-12-03 DIAGNOSIS — F419 Anxiety disorder, unspecified: Secondary | ICD-10-CM

## 2023-12-08 ENCOUNTER — Other Ambulatory Visit: Payer: Self-pay | Admitting: Family Medicine

## 2023-12-08 DIAGNOSIS — Z1231 Encounter for screening mammogram for malignant neoplasm of breast: Secondary | ICD-10-CM

## 2023-12-21 ENCOUNTER — Other Ambulatory Visit: Payer: Self-pay | Admitting: Family Medicine

## 2023-12-21 DIAGNOSIS — F32A Depression, unspecified: Secondary | ICD-10-CM

## 2023-12-21 NOTE — Telephone Encounter (Signed)
 Left message to return call to our office.  Pt need an office visit for medication refill.

## 2023-12-22 ENCOUNTER — Telehealth: Payer: Self-pay

## 2023-12-22 ENCOUNTER — Other Ambulatory Visit: Payer: Self-pay | Admitting: Family Medicine

## 2023-12-22 DIAGNOSIS — F32A Depression, unspecified: Secondary | ICD-10-CM

## 2023-12-22 NOTE — Telephone Encounter (Signed)
 Called pt to get worked up for 7:40 am visit 12/23/2023

## 2023-12-22 NOTE — Telephone Encounter (Signed)
 Patient returned office phone call and was scheduled for 12/23/23 at 7:40a, with Clent Ridges.

## 2023-12-23 ENCOUNTER — Ambulatory Visit (INDEPENDENT_AMBULATORY_CARE_PROVIDER_SITE_OTHER): Payer: Self-pay | Admitting: Family Medicine

## 2023-12-23 VITALS — BP 118/88 | HR 71 | Temp 98.4°F | Resp 20 | Ht 64.0 in | Wt 199.1 lb

## 2023-12-23 DIAGNOSIS — F39 Unspecified mood [affective] disorder: Secondary | ICD-10-CM

## 2023-12-23 DIAGNOSIS — R232 Flushing: Secondary | ICD-10-CM

## 2023-12-23 DIAGNOSIS — R7309 Other abnormal glucose: Secondary | ICD-10-CM

## 2023-12-23 DIAGNOSIS — K219 Gastro-esophageal reflux disease without esophagitis: Secondary | ICD-10-CM

## 2023-12-23 DIAGNOSIS — E559 Vitamin D deficiency, unspecified: Secondary | ICD-10-CM | POA: Diagnosis not present

## 2023-12-23 DIAGNOSIS — E538 Deficiency of other specified B group vitamins: Secondary | ICD-10-CM | POA: Diagnosis not present

## 2023-12-23 DIAGNOSIS — E785 Hyperlipidemia, unspecified: Secondary | ICD-10-CM | POA: Diagnosis not present

## 2023-12-23 DIAGNOSIS — I341 Nonrheumatic mitral (valve) prolapse: Secondary | ICD-10-CM

## 2023-12-23 DIAGNOSIS — Z1231 Encounter for screening mammogram for malignant neoplasm of breast: Secondary | ICD-10-CM

## 2023-12-23 LAB — CBC WITH DIFFERENTIAL/PLATELET
Basophils Absolute: 0.1 10*3/uL (ref 0.0–0.1)
Basophils Relative: 1 % (ref 0.0–3.0)
Eosinophils Absolute: 0.2 10*3/uL (ref 0.0–0.7)
Eosinophils Relative: 3.6 % (ref 0.0–5.0)
HCT: 39.8 % (ref 36.0–46.0)
Hemoglobin: 13.7 g/dL (ref 12.0–15.0)
Lymphocytes Relative: 31.7 % (ref 12.0–46.0)
Lymphs Abs: 1.7 10*3/uL (ref 0.7–4.0)
MCHC: 34.4 g/dL (ref 30.0–36.0)
MCV: 95.3 fl (ref 78.0–100.0)
Monocytes Absolute: 0.5 10*3/uL (ref 0.1–1.0)
Monocytes Relative: 9 % (ref 3.0–12.0)
Neutro Abs: 3 10*3/uL (ref 1.4–7.7)
Neutrophils Relative %: 54.7 % (ref 43.0–77.0)
Platelets: 240 10*3/uL (ref 150.0–400.0)
RBC: 4.18 Mil/uL (ref 3.87–5.11)
RDW: 12.7 % (ref 11.5–15.5)
WBC: 5.5 10*3/uL (ref 4.0–10.5)

## 2023-12-23 LAB — LIPID PANEL
Cholesterol: 179 mg/dL (ref 0–200)
HDL: 43.3 mg/dL (ref >39.00)
LDL Cholesterol: 108 mg/dL — ABNORMAL HIGH (ref 0–99)
NonHDL: 135.31
Total CHOL/HDL Ratio: 4
Triglycerides: 139 mg/dL (ref 0.0–149.0)
VLDL: 27.8 mg/dL (ref 0.0–40.0)

## 2023-12-23 LAB — VITAMIN D 25 HYDROXY (VIT D DEFICIENCY, FRACTURES): VITD: 34.26 ng/mL (ref 30.00–100.00)

## 2023-12-23 LAB — COMPREHENSIVE METABOLIC PANEL
ALT: 27 U/L (ref 0–35)
AST: 19 U/L (ref 0–37)
Albumin: 4.4 g/dL (ref 3.5–5.2)
Alkaline Phosphatase: 101 U/L (ref 39–117)
BUN: 14 mg/dL (ref 6–23)
CO2: 29 meq/L (ref 19–32)
Calcium: 9.4 mg/dL (ref 8.4–10.5)
Chloride: 102 meq/L (ref 96–112)
Creatinine, Ser: 1.07 mg/dL (ref 0.40–1.20)
GFR: 59.97 mL/min — ABNORMAL LOW (ref >60.00)
Glucose, Bld: 99 mg/dL (ref 70–99)
Potassium: 4 meq/L (ref 3.5–5.1)
Sodium: 139 meq/L (ref 135–145)
Total Bilirubin: 0.7 mg/dL (ref 0.2–1.2)
Total Protein: 6.9 g/dL (ref 6.0–8.3)

## 2023-12-23 LAB — TSH: TSH: 3.79 u[IU]/mL (ref 0.35–5.50)

## 2023-12-23 LAB — HEMOGLOBIN A1C: Hgb A1c MFr Bld: 5.4 % (ref 4.6–6.5)

## 2023-12-23 LAB — VITAMIN B12: Vitamin B-12: 207 pg/mL — ABNORMAL LOW (ref 211–911)

## 2023-12-23 MED ORDER — PRAVASTATIN SODIUM 20 MG PO TABS: 20.0000 mg | ORAL_TABLET | Freq: Every day | ORAL | 3 refills | Status: AC

## 2023-12-23 MED ORDER — DESVENLAFAXINE SUCCINATE ER 100 MG PO TB24: 100.0000 mg | ORAL_TABLET | Freq: Every day | ORAL | 3 refills | Status: AC

## 2023-12-23 MED ORDER — OMEPRAZOLE 20 MG PO CPDR: 20.0000 mg | DELAYED_RELEASE_CAPSULE | Freq: Every day | ORAL | 3 refills | Status: AC

## 2023-12-23 MED ORDER — METOPROLOL SUCCINATE ER 50 MG PO TB24: 50.0000 mg | ORAL_TABLET | Freq: Every day | ORAL | 3 refills | Status: AC

## 2023-12-23 MED ORDER — BUPROPION HCL ER (XL) 150 MG PO TB24: 450.0000 mg | ORAL_TABLET | Freq: Every day | ORAL | 11 refills | Status: AC

## 2023-12-23 MED ORDER — LORAZEPAM 1 MG PO TABS: 1.0000 mg | ORAL_TABLET | Freq: Two times a day (BID) | ORAL | 5 refills | Status: DC

## 2023-12-23 NOTE — Patient Instructions (Signed)
 It was a pleasure meeting you today. Thank you for allowing me to take part in your health care.  Our goals for today as we discussed include:  We will get some labs today.  If they are abnormal or we need to do something about them, I will call you.  If they are normal, I will send you a message on MyChart (if it is active) or a letter in the mail.  If you don't hear from [place] in 2 weeks, please call the office at the number below.   Refills sent for requested medications  Referral sent for Mammogram. Please call to schedule appointment. Due March 2025 [facility] [address] [place], [place] [address] [phone]     This is a list of the screening recommended for you and due dates:  Health Maintenance  Topic Date Due   Pneumococcal Vaccination (1 of 2 - PCV) Never done   COVID-19 Vaccine (3 - 2024-25 season) 06/13/2023   Mammogram  12/25/2023   Zoster (Shingles) Vaccine (2 of 2) 01/12/2024   Pap with HPV screening  06/21/2024   DTaP/Tdap/Td vaccine (2 - Td or Tdap) 10/12/2024   Colon Cancer Screening  05/29/2029   Flu Shot  Completed   Hepatitis C Screening  Completed   HIV Screening  Completed   HPV Vaccine  Aged Out     If you have any questions or concerns, please do not hesitate to call the office at [phone].  I look forward to our next visit and until then take care and stay safe.  Regards,   Dana Allan, MD   [facility]

## 2023-12-23 NOTE — Progress Notes (Signed)
 SUBJECTIVE:   Chief Complaint  Patient presents with   Medication Refill   HPI Presents for follow up chronic disease management  Discussed the use of AI scribe software for clinical note transcription with the patient, who gave verbal consent to proceed.  History of Present Illness The patient presents for a medication refill and reports symptoms of hot flashes and sleep disturbances.  She is seeking a medication refill for her prescriptions, including bupropion, Pristiq, metoprolol, Prilosec, and Pravachol. Her bupropion prescription expired two days ago, and she was unable to get a refill in time. She adjusted her medication intake by taking two pills on Tuesday, three on Wednesday, and two on Thursday, as Thursday was a short day for her.  She experiences hot flashes, which have been occurring since the beginning of the year and are becoming increasingly bothersome, particularly at night. These episodes disrupt her sleep, causing her to wake up two or three times a night either sweating or just starting to sweat, leading her to throw off her covers. She has not had a menstrual period since June, indicating she may be in the perimenopausal phase. She inquired about checking her estrogen levels due to experiencing hot flashes but was informed that this is not typically done unless there are specific symptoms or concerns. She is not interested in taking birth control pills, as she has never been on them and does not wish to start now.  She is fasting today for blood work, which she has not had done since February of the previous year. She is interested in checking her vitamin D levels, as she was previously on vitamin D supplementation for three months last year.      PERTINENT PMH / PSH: As above  OBJECTIVE:  BP 118/88   Pulse 71   Temp 98.4 F (36.9 C)   Resp 20   Ht 5\' 4"  (1.626 m)   Wt 199 lb 2 oz (90.3 kg)   LMP 03/17/2023 (Approximate)   SpO2 99%   BMI 34.18 kg/m     Physical Exam Vitals reviewed.  Constitutional:      General: She is not in acute distress.    Appearance: Normal appearance. She is obese. She is not ill-appearing, toxic-appearing or diaphoretic.  Eyes:     General:        Right eye: No discharge.        Left eye: No discharge.     Conjunctiva/sclera: Conjunctivae normal.  Cardiovascular:     Rate and Rhythm: Normal rate and regular rhythm.     Heart sounds: Normal heart sounds.  Pulmonary:     Effort: Pulmonary effort is normal.     Breath sounds: Normal breath sounds.  Musculoskeletal:        General: Normal range of motion.  Skin:    General: Skin is warm and dry.  Neurological:     General: No focal deficit present.     Mental Status: She is alert and oriented to person, place, and time. Mental status is at baseline.  Psychiatric:        Mood and Affect: Mood normal.        Behavior: Behavior normal.        Thought Content: Thought content normal.        Judgment: Judgment normal.           12/23/2023    7:55 AM 11/25/2022    8:29 AM 08/25/2022   10:24 AM 08/25/2022  9:36 AM 04/30/2022    3:18 PM  Depression screen PHQ 2/9  Decreased Interest 2 1 1 1 1   Down, Depressed, Hopeless 2 1 1 1 1   PHQ - 2 Score 4 2 2 2 2   Altered sleeping 3 1 1  2   Tired, decreased energy 3 1 1  1   Change in appetite 3 1 1  2   Feeling bad or failure about yourself  2 1 0  1  Trouble concentrating 0 0 0  0  Moving slowly or fidgety/restless 0 1 0  1  Suicidal thoughts 0 0 0  0  PHQ-9 Score 15 7 5  9   Difficult doing work/chores Somewhat difficult Somewhat difficult Somewhat difficult  Not difficult at all      12/23/2023    7:55 AM 11/25/2022    8:29 AM 08/25/2022   10:30 AM 09/23/2021   11:33 AM  GAD 7 : Generalized Anxiety Score  Nervous, Anxious, on Edge 2 1 1 1   Control/stop worrying 3 1 1 1   Worry too much - different things 2 1 1 1   Trouble relaxing 2 0 0 1  Restless 0 0 0 1  Easily annoyed or irritable 1 1 1 1    Afraid - awful might happen 0 0 0 1  Total GAD 7 Score 10 4 4 7   Anxiety Difficulty Somewhat difficult Somewhat difficult Somewhat difficult Somewhat difficult    ASSESSMENT/PLAN:  Mood disorder (HCC) Assessment & Plan: Chronic.  Stable.  Denies SI/HI.  Tolerating Wellbutrin, Pristiq and lorazepam.   -Refill Wellbutrin XL 450 mg daily -Refill Pristiq 100 mg daily -Refill Lorazepam 1 mg daily as needed    Orders: -     buPROPion HCl ER (XL); Take 3 tablets (450 mg total) by mouth daily.  Dispense: 90 tablet; Refill: 11 -     Comprehensive metabolic panel -     Desvenlafaxine Succinate ER; Take 1 tablet (100 mg total) by mouth daily.  Dispense: 90 tablet; Refill: 3 -     LORazepam; Take 1 tablet (1 mg total) by mouth 2 (two) times daily.  Dispense: 60 tablet; Refill: 5  Vitamin D deficiency Assessment & Plan: - Check vitamin D levels. - Recommend Vitamin D supplements  Orders: -     VITAMIN D 25 Hydroxy (Vit-D Deficiency, Fractures) -     Vitamin D (Ergocalciferol); Take 1 capsule (50,000 Units total) by mouth every 7 (seven) days.  Dispense: 12 capsule; Refill: 1  Vitamin B 12 deficiency -     Vitamin B12 -     Vitamin B-12; Place 2 tablets (2,000 mcg total) under the tongue daily.  Dispense: 90 tablet; Refill: 3  Hyperlipidemia, unspecified hyperlipidemia type Assessment & Plan: Lipid panel due. Currently on Pravachol. - Order lipid panel. - Refill Pravachol 20 mg daily  Orders: -     Lipid panel -     Pravastatin Sodium; Take 1 tablet (20 mg total) by mouth daily. After 6 pm  Dispense: 90 tablet; Refill: 3  Hot flashes Assessment & Plan: Worsening hot flashes, likely perimenopause. Prefers non-hormonal treatments. Veozah discussed, contraindicated with liver issues. Information on Veozah and other treatments provided. Delrae Rend MD mentioned for testosterone pellet therapy. - Check liver function tests today and monitor at 3, 6, and 9 months, then annually. - Provide  information on Veozah and other perimenopausal treatments. - Consider starting Veozah after reviewing liver function test results. - Provide information on [facility] MD for alternative treatments.  Orders: -     CBC with Differential/Platelet -     TSH  Abnormal glucose -     Hemoglobin A1c  Gastroesophageal reflux disease Assessment & Plan: GERD managed with Prilosec. - Refill Prilosec 20 mg daily  Orders: -     Omeprazole; Take 1 capsule (20 mg total) by mouth daily. 30 minutes before food  Dispense: 90 capsule; Refill: 3  Prolapsing mitral leaflet syndrome -     Metoprolol Succinate ER; Take 1 tablet (50 mg total) by mouth daily. NEEDS APPOINTMENT FOR FURTHER REFILLS  Dispense: 90 tablet; Refill: 3  MVP (mitral valve prolapse) Assessment & Plan: Chronic.  Stable.  -Refill metoprolol XL 50 mg daily   Gastroesophageal reflux disease, unspecified whether esophagitis present Assessment & Plan: GERD managed with Prilosec. - Refill Prilosec 20 mg daily       PDMP reviewed  Return if symptoms worsen or fail to improve, for PCP.  Dana Allan, MD

## 2023-12-27 ENCOUNTER — Encounter: Payer: Self-pay | Admitting: Family Medicine

## 2023-12-27 DIAGNOSIS — Z1231 Encounter for screening mammogram for malignant neoplasm of breast: Secondary | ICD-10-CM | POA: Insufficient documentation

## 2023-12-27 DIAGNOSIS — K219 Gastro-esophageal reflux disease without esophagitis: Secondary | ICD-10-CM | POA: Insufficient documentation

## 2023-12-27 DIAGNOSIS — R7309 Other abnormal glucose: Secondary | ICD-10-CM | POA: Insufficient documentation

## 2023-12-27 DIAGNOSIS — R232 Flushing: Secondary | ICD-10-CM | POA: Insufficient documentation

## 2023-12-27 MED ORDER — VITAMIN B-12 1000 MCG SL SUBL: 2000.0000 ug | SUBLINGUAL_TABLET | Freq: Every day | SUBLINGUAL | 3 refills | Status: DC

## 2023-12-27 MED ORDER — VITAMIN D (ERGOCALCIFEROL) 1.25 MG (50000 UNIT) PO CAPS: 50000.0000 [IU] | ORAL_CAPSULE | ORAL | 1 refills | Status: AC

## 2023-12-27 NOTE — Assessment & Plan Note (Signed)
 Lipid panel due. Currently on Pravachol. - Order lipid panel. - Refill Pravachol 20 mg daily

## 2023-12-27 NOTE — Assessment & Plan Note (Signed)
 Chronic.  Stable.  -Refill metoprolol XL 50 mg daily

## 2023-12-27 NOTE — Assessment & Plan Note (Addendum)
 Chronic.  Stable.  Denies SI/HI.  Tolerating Wellbutrin, Pristiq and lorazepam.   -Refill Wellbutrin XL 450 mg daily -Refill Pristiq 100 mg daily -Refill Lorazepam 1 mg daily as needed

## 2023-12-27 NOTE — Assessment & Plan Note (Signed)
 Worsening hot flashes, likely perimenopause. Prefers non-hormonal treatments. Veozah discussed, contraindicated with liver issues. Information on Veozah and other treatments provided. Delrae Rend MD mentioned for testosterone pellet therapy. - Check liver function tests today and monitor at 3, 6, and 9 months, then annually. - Provide information on Veozah and other perimenopausal treatments. - Consider starting Veozah after reviewing liver function test results. - Provide information on [facility] MD for alternative treatments.

## 2023-12-27 NOTE — Assessment & Plan Note (Signed)
-   Check vitamin D levels. - Recommend Vitamin D supplements

## 2023-12-27 NOTE — Assessment & Plan Note (Signed)
 GERD managed with Prilosec. - Refill Prilosec 20 mg daily

## 2023-12-29 ENCOUNTER — Ambulatory Visit
Admission: RE | Admit: 2023-12-29 | Discharge: 2023-12-29 | Disposition: A | Discharge status: Home / Self Care | Payer: 59 | Source: Ambulatory Visit | Attending: Family Medicine | Admitting: Family Medicine

## 2023-12-29 DIAGNOSIS — Z1231 Encounter for screening mammogram for malignant neoplasm of breast: Secondary | ICD-10-CM | POA: Insufficient documentation

## 2024-06-26 ENCOUNTER — Other Ambulatory Visit: Payer: Self-pay | Admitting: Medical Genetics

## 2024-06-27 ENCOUNTER — Encounter: Payer: Self-pay | Admitting: Nurse Practitioner

## 2024-06-27 ENCOUNTER — Ambulatory Visit: Payer: Self-pay | Admitting: Nurse Practitioner

## 2024-06-27 VITALS — BP 118/82 | HR 64 | Temp 98.0°F | Ht 64.0 in | Wt 197.8 lb

## 2024-06-27 DIAGNOSIS — E785 Hyperlipidemia, unspecified: Secondary | ICD-10-CM | POA: Diagnosis not present

## 2024-06-27 DIAGNOSIS — R5383 Other fatigue: Secondary | ICD-10-CM | POA: Diagnosis not present

## 2024-06-27 DIAGNOSIS — Z124 Encounter for screening for malignant neoplasm of cervix: Secondary | ICD-10-CM

## 2024-06-27 DIAGNOSIS — F39 Unspecified mood [affective] disorder: Secondary | ICD-10-CM

## 2024-06-27 DIAGNOSIS — E559 Vitamin D deficiency, unspecified: Secondary | ICD-10-CM

## 2024-06-27 DIAGNOSIS — K219 Gastro-esophageal reflux disease without esophagitis: Secondary | ICD-10-CM

## 2024-06-27 DIAGNOSIS — I341 Nonrheumatic mitral (valve) prolapse: Secondary | ICD-10-CM

## 2024-06-27 DIAGNOSIS — E538 Deficiency of other specified B group vitamins: Secondary | ICD-10-CM | POA: Diagnosis not present

## 2024-06-27 LAB — COMPREHENSIVE METABOLIC PANEL WITH GFR
ALT: 34 U/L (ref 0–35)
AST: 20 U/L (ref 0–37)
Albumin: 4.2 g/dL (ref 3.5–5.2)
Alkaline Phosphatase: 113 U/L (ref 39–117)
BUN: 13 mg/dL (ref 6–23)
CO2: 30 meq/L (ref 19–32)
Calcium: 9.3 mg/dL (ref 8.4–10.5)
Chloride: 108 meq/L (ref 96–112)
Creatinine, Ser: 1.26 mg/dL — ABNORMAL HIGH (ref 0.40–1.20)
GFR: 49.11 mL/min — ABNORMAL LOW (ref >60.00)
Glucose, Bld: 106 mg/dL — ABNORMAL HIGH (ref 70–99)
Potassium: 3.8 meq/L (ref 3.5–5.1)
Sodium: 137 meq/L (ref 135–145)
Total Bilirubin: 0.4 mg/dL (ref 0.2–1.2)
Total Protein: 6.4 g/dL (ref 6.0–8.3)

## 2024-06-27 LAB — TSH: TSH: 3.38 u[IU]/mL (ref 0.35–5.50)

## 2024-06-27 LAB — VITAMIN B12: Vitamin B-12: 185 pg/mL — ABNORMAL LOW (ref 211–911)

## 2024-06-27 LAB — VITAMIN D 25 HYDROXY (VIT D DEFICIENCY, FRACTURES): VITD: 46.71 ng/mL (ref 30.00–100.00)

## 2024-06-27 NOTE — Progress Notes (Signed)
 Leron Glance, NP-C Phone: [phone]  Carol Stevens is a 53 y.o. female who presents today for transfer of care.   Discussed the use of AI scribe software for clinical note transcription with the patient, who gave verbal consent to proceed.  History of Present Illness   Carol Stevens is a 53 year old female who presents for transfer of care with fatigue and concerns about mold exposure.  She experiences persistent fatigue and low energy levels, which she attributes to potential mold exposure in her home or seasonal affective disorder. Her husband had mold-related illness four years ago, but she did not. She describes feeling exhausted, especially after working six days last week unexpectedly, and often wants to 'lay around and sleep' when not working.  She has a history of depression and is currently taking Wellbutrin  and Pristiq . She has a history of depression and has been on various antidepressants over the years. She takes ativan  only when going to work or during family events, which can be tense due to her large family. She is uncertain if her current symptoms are due to depression, seasonal changes, or other factors.  She reports nasal passage issues and had an ear problem last month, which she addressed through a virtual visit. She takes chlorpheniramine 4 mg once or twice a day for allergies, purchased from [employer].  She is postmenopausal, having not had a period for over a year, and experiences brain fog. Her hot flashes have improved but still occur occasionally, especially at night, causing her to wake up with sweat on her neck.  She is not taking B12 due to concerns about increased appetite and weight gain. She is trying to increase her water intake despite disliking it. She has a history of mitral valve prolapse and takes metoprolol  once a day, pravastatin  for cholesterol, and Prilosec for acid reflux. No trouble swallowing or blood in stool.  She works at a [employer]  and has been there for 25 years. She does not smoke but works with a smoker.      Social History   Tobacco Use  Smoking Status Never  Smokeless Tobacco Never  Tobacco Comments   [place] light smoker     Current Outpatient Medications on File Prior to Visit  Medication Sig Dispense Refill   buPROPion  (WELLBUTRIN  XL) 150 MG 24 hr tablet Take 3 tablets (450 mg total) by mouth daily. 90 tablet 11   desvenlafaxine  (PRISTIQ ) 100 MG 24 hr tablet Take 1 tablet (100 mg total) by mouth daily. 90 tablet 3   LORazepam  (ATIVAN ) 1 MG tablet Take 1 tablet (1 mg total) by mouth 2 (two) times daily. 60 tablet 5   metoprolol  succinate (TOPROL -XL) 50 MG 24 hr tablet Take 1 tablet (50 mg total) by mouth daily. NEEDS APPOINTMENT FOR FURTHER REFILLS 90 tablet 3   omeprazole  (PRILOSEC) 20 MG capsule Take 1 capsule (20 mg total) by mouth daily. 30 minutes before food 90 capsule 3   pravastatin  (PRAVACHOL ) 20 MG tablet Take 1 tablet (20 mg total) by mouth daily. After 6 pm 90 tablet 3   SUMAtriptan  (IMITREX ) 50 MG tablet Take 1 tablet (50 mg total) by mouth daily as needed for migraine. May repeat in 2 hours if headache persists or recurs. Max dose 200 mg/24 hrs 10 tablet 11   Vitamin D , Ergocalciferol , (DRISDOL ) 1.25 MG (50000 UNIT) CAPS capsule Take 1 capsule (50,000 Units total) by mouth every 7 (seven) days. 12 capsule 1   No current facility-administered  medications on file prior to visit.     ROS see history of present illness  Objective  Physical Exam Vitals:   06/27/24 0950  BP: 118/82  Pulse: 64  Temp: 98 F (36.7 C)  SpO2: 98%    BP Readings from Last 3 Encounters:  06/27/24 118/82  12/23/23 118/88  11/25/22 118/78   Wt Readings from Last 3 Encounters:  06/27/24 197 lb 12.8 oz (89.7 kg)  12/23/23 199 lb 2 oz (90.3 kg)  11/25/22 194 lb (88 kg)    Physical Exam Constitutional:      General: She is not in acute distress.    Appearance: Normal appearance.  HENT:     Head:  Normocephalic.  Cardiovascular:     Rate and Rhythm: Normal rate and regular rhythm.     Heart sounds: Normal heart sounds.  Pulmonary:     Effort: Pulmonary effort is normal.     Breath sounds: Normal breath sounds.  Skin:    General: Skin is warm and dry.  Neurological:     General: No focal deficit present.     Mental Status: She is alert.  Psychiatric:        Mood and Affect: Mood normal.        Behavior: Behavior normal.      Assessment/Plan: Please see individual problem list.  Fatigue, unspecified type Assessment & Plan: Chronic fatigue is likely multifactorial. Contributing factors could be depression, vitamin deficiencies, and/or menopause, with reports of brain fog and low energy. Hormonal contributions were discussed. Check lab work as outlined. Further work up pending.   Orders: -     TSH -     Fungus culture, blood  Mood disorder Assessment & Plan: She experiences chronic depression with fatigue and low energy, currently managed with Wellbutrin  and Pristiq . Despite a history of multiple antidepressants, she is not interested in changing medications. Seasonal affective disorder may influence her symptoms. Continue Wellbutrin  and Pristiq  as prescribed. Encouraged to contact if worsening symptoms, unusual behavior changes or suicidal thoughts occur.   Screening for cervical cancer Assessment & Plan: She is postmenopausal, experiencing fatigue and hot flashes, and is not interested in hormone replacement therapy. The risks and benefits of hormone therapy, including low estrogen levels, were discussed. She has the option to consult a specialist. Refer to OBGYN for a Pap smear and further discussion on hormone replacement therapy.  Orders: -     Ambulatory referral to Obstetrics / Gynecology  Hyperlipidemia, unspecified hyperlipidemia type Assessment & Plan: Hyperlipidemia is managed with pravastatin . Continue pravastatin  as prescribed.  Orders: -      Comprehensive metabolic panel with GFR  MVP (mitral valve prolapse) Assessment & Plan: Mitral valve prolapse is managed with metoprolol . Continue metoprolol  as prescribed.   Gastroesophageal reflux disease, unspecified whether esophagitis present Assessment & Plan: GERD is managed with Prilosec, with no dysphagia or gastrointestinal bleeding reported. Continue Prilosec as prescribed. Encourage dietary modifications.    Vitamin B 12 deficiency Assessment & Plan: She has a vitamin B12 deficiency and reports increased appetite with supplementation, though she is not currently taking it. Check B12 levels.  Orders: -     Vitamin B12  Vitamin D  deficiency Assessment & Plan: She has a vitamin D  deficiency and is on weekly vitamin D  supplementation. Continue. Check vitamin D  levels.  Orders: -     VITAMIN D  25 Hydroxy (Vit-D Deficiency, Fractures)      Return in about 6 months (around 12/25/2024) for Follow up.  Leron Glance, NP-C [facility]

## 2024-07-11 ENCOUNTER — Encounter: Payer: Self-pay | Admitting: Nurse Practitioner

## 2024-07-11 DIAGNOSIS — R5383 Other fatigue: Secondary | ICD-10-CM | POA: Insufficient documentation

## 2024-07-11 DIAGNOSIS — Z124 Encounter for screening for malignant neoplasm of cervix: Secondary | ICD-10-CM | POA: Insufficient documentation

## 2024-07-11 NOTE — Assessment & Plan Note (Signed)
 GERD is managed with Prilosec, with no dysphagia or gastrointestinal bleeding reported. Continue Prilosec as prescribed. Encourage dietary modifications.

## 2024-07-11 NOTE — Assessment & Plan Note (Signed)
 She has a vitamin D  deficiency and is on weekly vitamin D  supplementation. Continue. Check vitamin D  levels.

## 2024-07-11 NOTE — Assessment & Plan Note (Signed)
 She has a vitamin B12 deficiency and reports increased appetite with supplementation, though she is not currently taking it. Check B12 levels.

## 2024-07-11 NOTE — Assessment & Plan Note (Signed)
 She is postmenopausal, experiencing fatigue and hot flashes, and is not interested in hormone replacement therapy. The risks and benefits of hormone therapy, including low estrogen levels, were discussed. She has the option to consult a specialist. Refer to OBGYN for a Pap smear and further discussion on hormone replacement therapy.

## 2024-07-11 NOTE — Assessment & Plan Note (Signed)
 Mitral valve prolapse is managed with metoprolol . Continue metoprolol  as prescribed.

## 2024-07-11 NOTE — Assessment & Plan Note (Addendum)
 She experiences chronic depression with fatigue and low energy, currently managed with Wellbutrin  and Pristiq . Despite a history of multiple antidepressants, she is not interested in changing medications. Seasonal affective disorder may influence her symptoms. Continue Wellbutrin  and Pristiq  as prescribed. Encouraged to contact if worsening symptoms, unusual behavior changes or suicidal thoughts occur.

## 2024-07-11 NOTE — Assessment & Plan Note (Signed)
 Chronic fatigue is likely multifactorial. Contributing factors could be depression, vitamin deficiencies, and/or menopause, with reports of brain fog and low energy. Hormonal contributions were discussed. Check lab work as outlined. Further work up pending.

## 2024-07-11 NOTE — Assessment & Plan Note (Signed)
 Hyperlipidemia is managed with pravastatin . Continue pravastatin  as prescribed.

## 2024-07-12 ENCOUNTER — Other Ambulatory Visit: Payer: Self-pay | Admitting: Nurse Practitioner

## 2024-07-12 DIAGNOSIS — F39 Unspecified mood [affective] disorder: Secondary | ICD-10-CM

## 2024-07-13 ENCOUNTER — Ambulatory Visit: Payer: Self-pay | Admitting: Nurse Practitioner

## 2024-07-13 ENCOUNTER — Other Ambulatory Visit: Payer: Self-pay | Admitting: Nurse Practitioner

## 2024-07-13 DIAGNOSIS — R944 Abnormal results of kidney function studies: Secondary | ICD-10-CM

## 2024-07-17 ENCOUNTER — Other Ambulatory Visit

## 2024-07-17 DIAGNOSIS — R944 Abnormal results of kidney function studies: Secondary | ICD-10-CM | POA: Diagnosis not present

## 2024-07-17 LAB — BASIC METABOLIC PANEL WITH GFR
BUN: 16 mg/dL (ref 6–23)
CO2: 25 meq/L (ref 19–32)
Calcium: 9.2 mg/dL (ref 8.4–10.5)
Chloride: 104 meq/L (ref 96–112)
Creatinine, Ser: 1.01 mg/dL (ref 0.40–1.20)
GFR: 64.02 mL/min (ref >60.00)
Glucose, Bld: 160 mg/dL — ABNORMAL HIGH (ref 70–99)
Potassium: 3.8 meq/L (ref 3.5–5.1)
Sodium: 141 meq/L (ref 135–145)

## 2024-07-18 ENCOUNTER — Ambulatory Visit: Payer: Self-pay | Admitting: Nurse Practitioner

## 2024-08-15 NOTE — Patient Instructions (Incomplete)
 Preventive Care 52-52 Years Old, Female  Preventive care refers to lifestyle choices and visits with your health care provider that can promote health and wellness. Preventive care visits are also called wellness exams.  What can I expect for my preventive care visit?  Counseling  Your health care provider may ask you questions about your:  Medical history, including:  Past medical problems.  Family medical history.  Pregnancy history.  Current health, including:  Menstrual cycle.  Method of birth control.  Emotional well-being.  Home life and relationship well-being.  Sexual activity and sexual health.  Lifestyle, including:  Alcohol, nicotine or tobacco, and drug use.  Access to firearms.  Diet, exercise, and sleep habits.  Work and work Astronomer.  Sunscreen use.  Safety issues such as seatbelt and bike helmet use.  Physical exam  Your health care provider will check your:  Height and weight. These may be used to calculate your BMI (body mass index). BMI is a measurement that tells if you are at a healthy weight.  Waist circumference. This measures the distance around your waistline. This measurement also tells if you are at a healthy weight and may help predict your risk of certain diseases, such as type 2 diabetes and high blood pressure.  Heart rate and blood pressure.  Body temperature.  Skin for abnormal spots.  What immunizations do I need?    Vaccines are usually given at various ages, according to a schedule. Your health care provider will recommend vaccines for you based on your age, medical history, and lifestyle or other factors, such as travel or where you work.  What tests do I need?  Screening  Your health care provider may recommend screening tests for certain conditions. This may include:  Lipid and cholesterol levels.  Diabetes screening. This is done by checking your blood sugar (glucose) after you have not eaten for a while (fasting).  Pelvic exam and Pap test.  Hepatitis B test.  Hepatitis C  test.  HIV (human immunodeficiency virus) test.  STI (sexually transmitted infection) testing, if you are at risk.  Lung cancer screening.  Colorectal cancer screening.  Mammogram. Talk with your health care provider about when you should start having regular mammograms. This may depend on whether you have a family history of breast cancer.  BRCA-related cancer screening. This may be done if you have a family history of breast, ovarian, tubal, or peritoneal cancers.  Bone density scan. This is done to screen for osteoporosis.  Talk with your health care provider about your test results, treatment options, and if necessary, the need for more tests.  Follow these instructions at home:  Eating and drinking    Eat a diet that includes fresh fruits and vegetables, whole grains, lean protein, and low-fat dairy products.  Take vitamin and mineral supplements as recommended by your health care provider.  Do not drink alcohol if:  Your health care provider tells you not to drink.  You are pregnant, may be pregnant, or are planning to become pregnant.  If you drink alcohol:  Limit how much you have to 0-1 drink a day.  Know how much alcohol is in your drink. In the [place] one drink equals one 12 oz bottle of beer (355 mL), one 5 oz glass of wine (148 mL), or one 1 oz glass of hard liquor (44 mL).  Lifestyle  Brush your teeth every morning and night with fluoride toothpaste. Floss one time each day.  Exercise for at least  30 minutes 5 or more days each week.  Do not use any products that contain nicotine or tobacco. These products include cigarettes, chewing tobacco, and vaping devices, such as e-cigarettes. If you need help quitting, ask your health care provider.  Do not use drugs.  If you are sexually active, practice safe sex. Use a condom or other form of protection to prevent STIs.  If you do not wish to become pregnant, use a form of birth control. If you plan to become pregnant, see your health care provider for a  prepregnancy visit.  Take aspirin only as told by your health care provider. Make sure that you understand how much to take and what form to take. Work with your health care provider to find out whether it is safe and beneficial for you to take aspirin daily.  Find healthy ways to manage stress, such as:  Meditation, yoga, or listening to music.  Journaling.  Talking to a trusted person.  Spending time with friends and family.  Minimize exposure to UV radiation to reduce your risk of skin cancer.  Safety  Always wear your seat belt while driving or riding in a vehicle.  Do not drive:  If you have been drinking alcohol. Do not ride with someone who has been drinking.  When you are tired or distracted.  While texting.  If you have been using any mind-altering substances or drugs.  Wear a helmet and other protective equipment during sports activities.  If you have firearms in your house, make sure you follow all gun safety procedures.  Seek help if you have been physically or sexually abused.  What's next?  Visit your health care provider once a year for an annual wellness visit.  Ask your health care provider how often you should have your eyes and teeth checked.  Stay up to date on all vaccines.  This information is not intended to replace advice given to you by your health care provider. Make sure you discuss any questions you have with your health care provider.  Document Revised: 03/26/2021 Document Reviewed: 03/26/2021  Elsevier Patient Education  2024 [employer].

## 2024-08-15 NOTE — Progress Notes (Unsigned)
 Gynecology Annual Exam  PCP: Gretel App, NP  Chief Complaint: No chief complaint on file.   History of Present Illness:Patient is a 53 y.o. G2P2002 presents for annual exam. The patient has no complaints today.   LMP: No LMP recorded. Patient is perimenopausal. Menarche:{numbers 0-81:85324} Average Interval: {Desc; regular/irreg:14544}, {numbers 22-35:14824} days Duration of flow: {numbers; 0-10:33138} days Heavy Menses: {yes/no:63} Clots: {yes/no:63} Intermenstrual Bleeding: {yes/no:63} Postcoital Bleeding: {yes/no:63} Dysmenorrhea: {yes/no:63}  The patient {sys sexually active:13135} sexually active. She {has/denies:315300} dyspareunia.  The patient {DOES_DOES WNU:81435} perform self breast exams.  There {is/is no:19420} notable family history of breast or ovarian cancer in her family.  The patient wears seatbelts: {yes/no:63}.   The patient has regular exercise: {yes/no/not asked:9010}.    The patient {Blank single:19197::reports,denies} current symptoms of depression.     Review of Systems: ROS  Past Medical History:  Patient Active Problem List   Diagnosis Date Noted   Fatigue 07/11/2024   Screening for cervical cancer 07/11/2024   Gastroesophageal reflux disease 12/27/2023   Hot flashes 12/27/2023   Vitamin D  deficiency 11/27/2022   Vitamin B 12 deficiency 11/27/2022   Obesity (BMI 30-39.9) 11/27/2022   Mood disorder 11/27/2022   MVP (mitral valve prolapse) 05/01/2022   Breast calcification, left 09/02/2020   Gastrocnemius equinus, right 08/01/2020    Formatting of this note might be different from the original. Added automatically from request for surgery [id number]    Trimalleolar fracture of ankle, closed, right, initial encounter 09/11/2019    Added automatically from request for surgery [id number]    Polymenorrhea 06/22/2019   Elevated blood pressure reading 05/02/2019   Hyperlipidemia 05/02/2019   Loose stools 11/25/2018   Right knee pain  11/25/2018   Sleep apnea 08/27/2015   Anxiety and depression 08/27/2015   Ejection murmur 08/27/2015    Past Surgical History:  Past Surgical History:  Procedure Laterality Date   ACHILLES TENDON SURGERY Right 08/30/2020   ANKLE FRACTURE SURGERY Right 09/12/2019   COLONOSCOPY WITH PROPOFOL  N/A 05/30/2019   Procedure: COLONOSCOPY WITH PROPOFOL ;  Surgeon: Gaylyn Gladis PENNER, MD;  Location[facility] ENDOSCOPY;  Service: Endoscopy;  Laterality: N/A;   FLEXIBLE SIGMOIDOSCOPY N/A 05/09/2021   Procedure: FLEXIBLE SIGMOIDOSCOPY;  Surgeon: Maryruth Ole DASEN, MD;  Location: [facility] ENDOSCOPY;  Service: Endoscopy;  Laterality: N/A;   TUBAL LIGATION      Gynecologic History:  No LMP recorded. Patient is perimenopausal. Last Pap: Results were: 06/22/2019 no abnormalities  Last mammogram: 12/29/2023 Results were: DONNA I  Obstetric History: [id number]  Family History:  Family History  Problem Relation Age of Onset   Depression Mother    Mental illness Mother    Depression Maternal Grandmother    Arthritis Maternal Grandmother    Heart disease Maternal Grandmother    Hyperlipidemia Maternal Grandmother    Mental illness Maternal Grandmother    Depression Paternal Grandmother    Stroke Paternal Grandmother    Heart disease Paternal Grandmother    Alcohol abuse Father    Cancer Father        lung smoker   Early death Father    Heart disease Father    Depression Daughter    Crohn's disease Daughter    Cancer Paternal Aunt        lung    Cancer Paternal Uncle        ? type   Birth defects Maternal Grandfather    COPD Maternal Grandfather    Breast cancer Neg Hx     Social  History:  Social History   Socioeconomic History   Marital status: Married    Spouse name: Not on file   Number of children: Not on file   Years of education: Not on file   Highest education level: 12th grade  Occupational History   Not on file  Tobacco Use   Smoking status: Never   Smokeless tobacco: Never    Tobacco comments:    teeanger light smoker   Vaping Use   Vaping status: Never Used  Substance and Sexual Activity   Alcohol use: Yes    Alcohol/week: 0.0 standard drinks of alcohol   Drug use: No   Sexual activity: Yes    Birth control/protection: Surgical  Other Topics Concern   Not on file  Social History Narrative   Married    2 kids    Works [employer] x 20 years [employer]   Owns guns, wears seat belt, safe in relationship    Social Drivers of Corporate Investment Banker Strain: Low Risk  (06/26/2024)   Overall Financial Resource Strain (CARDIA)    Difficulty of Paying Living Expenses: Not hard at all  Food Insecurity: No Food Insecurity (06/26/2024)   Hunger Vital Sign    Worried About Running Out of Food in the Last Year: Never true    Ran Out of Food in the Last Year: Never true  Transportation Needs: No Transportation Needs (06/26/2024)   PRAPARE - Administrator, Civil Service (Medical): No    Lack of Transportation (Non-Medical): No  Physical Activity: Inactive (06/26/2024)   Exercise Vital Sign    Days of Exercise per Week: 0 days    Minutes of Exercise per Session: Not on file  Stress: Stress Concern Present (06/26/2024)   [employer] of Occupational Health - Occupational Stress Questionnaire    Feeling of Stress: To some extent  Social Connections: Socially Isolated (06/26/2024)   Social Connection and Isolation Panel    Frequency of Communication with Friends and Family: Never    Frequency of Social Gatherings with Friends and Family: Never    Attends Religious Services: Never    Database Administrator or Organizations: No    Attends Engineer, Structural: Not on file    Marital Status: Married  Catering Manager Violence: Not At Risk (09/12/2019)   Received from [facility]   Humiliation, Afraid, Rape, and Kick questionnaire    Within the last year, have you been afraid of your partner or ex-partner?: No    Within the last  year, have you been humiliated or emotionally abused in other ways by your partner or ex-partner?: No    Within the last year, have you been kicked, hit, slapped, or otherwise physically hurt by your partner or ex-partner?: No    Within the last year, have you been raped or forced to have any kind of sexual activity by your partner or ex-partner?: No    Allergies:  Allergies  Allergen Reactions   Azithromycin      NOT EFFECTIVE per PT    Medications: Prior to Admission medications   Medication Sig Start Date End Date Taking? Authorizing Provider  buPROPion  (WELLBUTRIN  XL) 150 MG 24 hr tablet Take 3 tablets (450 mg total) by mouth daily. 12/23/23   Hope Merle, MD  desvenlafaxine  (PRISTIQ ) 100 MG 24 hr tablet Take 1 tablet (100 mg total) by mouth daily. 12/23/23   Hope Merle, MD  LORazepam  (ATIVAN ) 1 MG tablet TAKE  1 TABLET BY MOUTH TWICE A DAY 07/12/24   Gretel App, NP  metoprolol  succinate (TOPROL -XL) 50 MG 24 hr tablet Take 1 tablet (50 mg total) by mouth daily. NEEDS APPOINTMENT FOR FURTHER REFILLS 12/23/23   Hope Merle, MD  omeprazole  (PRILOSEC) 20 MG capsule Take 1 capsule (20 mg total) by mouth daily. 30 minutes before food 12/23/23   Hope Merle, MD  pravastatin  (PRAVACHOL ) 20 MG tablet Take 1 tablet (20 mg total) by mouth daily. After 6 pm 12/23/23   Hope Merle, MD  SUMAtriptan  (IMITREX ) 50 MG tablet Take 1 tablet (50 mg total) by mouth daily as needed for migraine. May repeat in 2 hours if headache persists or recurs. Max dose 200 mg/24 hrs 04/30/22   McLean-Scocuzza, Randine SAILOR, MD  Vitamin D , Ergocalciferol , (DRISDOL ) 1.25 MG (50000 UNIT) CAPS capsule Take 1 capsule (50,000 Units total) by mouth every 7 (seven) days. 12/27/23   Hope Merle, MD    Physical Exam Vitals: There were no vitals taken for this visit.  General: NAD HEENT: normocephalic, anicteric Thyroid: no enlargement, no palpable nodules Pulmonary: No increased work of breathing, CTAB Cardiovascular: RRR, distal  pulses 2+ Breast: Breast symmetrical, no tenderness, no palpable nodules or masses, no skin or nipple retraction present, no nipple discharge.  No axillary or supraclavicular lymphadenopathy. Abdomen: NABS, soft, non-tender, non-distended.  Umbilicus without lesions.  No hepatomegaly, splenomegaly or masses palpable. No evidence of hernia  Genitourinary:  External: Normal external female genitalia.  Normal urethral meatus, normal Bartholin's and Skene's glands.    Vagina: Normal vaginal mucosa, no evidence of prolapse.    Cervix: Grossly normal in appearance, no bleeding  Uterus: Non-enlarged, mobile, normal contour.  No CMT  Adnexa: ovaries non-enlarged, no adnexal masses  Rectal: deferred  Lymphatic: no evidence of inguinal lymphadenopathy Extremities: no edema, erythema, or tenderness Neurologic: Grossly intact Psychiatric: mood appropriate, affect full  Female chaperone present for pelvic and breast  portions of the physical exam     Assessment: 53 y.o. G2P2002 routine annual exam  Plan: Problem List Items Addressed This Visit   None   1) Mammogram - recommend yearly screening mammogram.  Mammogram {Blank single:19197::Is up to date,Was ordered today}  2) STI screening  {Blank single:19197::was,was not}offered and {Blank single:19197::accepted,declined,therefore not obtained}  3) ASCCP guidelines and rational discussed.  Patient opts for {Blank single:19197::***,every 5 years,every 3 years,yearly,discontinue age >65,discontinue secondary to prior hysterectomy} screening interval  4) Osteoporosis  - per USPTF routine screening DEXA at age 41 - FRAX 10 year major fracture risk ***,  10 year hip fracture risk ***  Consider FDA-approved medical therapies in postmenopausal women and men aged 24 years and older, based on the following: a) A hip or vertebral (clinical or morphometric) fracture b) T-score <= -2.5 at the femoral neck or spine after  appropriate evaluation to exclude secondary causes C) Low bone mass (T-score between -1.0 and -2.5 at the femoral neck or spine) and a 10-year probability of a hip fracture >= 3% or a 10-year probability of a major osteoporosis-related fracture >= 20% based on the [place] -adapted WHO algorithm   5) Routine healthcare maintenance including cholesterol, diabetes screening discussed {Blank single:19197::managed by PCP,Ordered today,To return fasting at a later date,Declines}  6) Colonoscopy ***.  Screening recommended starting at age 68 for average risk individuals, age 56 for individuals deemed at increased risk (including African Americans) and recommended to continue until age 71.  For patient age 33-85 individualized approach is recommended.  Gold standard screening is  via colonoscopy, Cologuard screening is an acceptable alternative for patient unwilling or unable to undergo colonoscopy.  Colorectal cancer screening for average?risk adults: 2018 guideline update from the American Cancer SocietyCA: A Cancer Journal for Clinicians: Mar 10, 2017   7) No follow-ups on file.    Rollo JINNY Maxin, CMA 08/15/2024 2:58 PM

## 2024-08-16 ENCOUNTER — Encounter: Payer: Self-pay | Admitting: Advanced Practice Midwife

## 2024-08-16 ENCOUNTER — Other Ambulatory Visit (HOSPITAL_COMMUNITY)
Admission: RE | Admit: 2024-08-16 | Discharge: 2024-08-16 | Disposition: A | Discharge status: Home / Self Care | Source: Ambulatory Visit | Attending: Advanced Practice Midwife | Admitting: Advanced Practice Midwife

## 2024-08-16 ENCOUNTER — Ambulatory Visit: Admitting: Advanced Practice Midwife

## 2024-08-16 VITALS — BP 127/80 | HR 65 | Ht 64.0 in | Wt 195.9 lb

## 2024-08-16 DIAGNOSIS — Z01411 Encounter for gynecological examination (general) (routine) with abnormal findings: Secondary | ICD-10-CM | POA: Diagnosis not present

## 2024-08-16 DIAGNOSIS — Z124 Encounter for screening for malignant neoplasm of cervix: Secondary | ICD-10-CM

## 2024-08-16 DIAGNOSIS — N951 Menopausal and female climacteric states: Secondary | ICD-10-CM

## 2024-08-16 DIAGNOSIS — Z01419 Encounter for gynecological examination (general) (routine) without abnormal findings: Secondary | ICD-10-CM | POA: Diagnosis present

## 2024-08-16 MED ORDER — ESTRADIOL-NORETHINDRONE ACET 1-0.5 MG PO TABS: 1.0000 | ORAL_TABLET | Freq: Every day | ORAL | 11 refills | Status: AC

## 2024-08-17 ENCOUNTER — Encounter: Payer: Self-pay | Admitting: Advanced Practice Midwife

## 2024-08-17 LAB — CYTOLOGY - PAP
Adequacy: ABSENT
Comment: NEGATIVE
Diagnosis: NEGATIVE
High risk HPV: NEGATIVE

## 2024-09-01 ENCOUNTER — Other Ambulatory Visit: Payer: Self-pay | Admitting: Nurse Practitioner

## 2024-09-01 DIAGNOSIS — E559 Vitamin D deficiency, unspecified: Secondary | ICD-10-CM

## 2024-11-07 ENCOUNTER — Other Ambulatory Visit: Payer: Self-pay | Admitting: Medical Genetics

## 2024-11-07 DIAGNOSIS — Z006 Encounter for examination for normal comparison and control in clinical research program: Secondary | ICD-10-CM

## 2024-11-15 ENCOUNTER — Other Ambulatory Visit: Payer: Self-pay

## 2024-11-15 DIAGNOSIS — G43919 Migraine, unspecified, intractable, without status migrainosus: Secondary | ICD-10-CM

## 2024-11-15 MED ORDER — SUMATRIPTAN SUCCINATE 50 MG PO TABS: 50.0000 mg | ORAL_TABLET | Freq: Every day | ORAL | 11 refills | Status: AC | PRN

## 2024-12-26 ENCOUNTER — Ambulatory Visit: Admitting: Nurse Practitioner
